# Patient Record
Sex: Male | Born: 1990 | Race: Black or African American | Hispanic: No | Marital: Single | Smoking: Current every day smoker
Health system: Southern US, Community
[De-identification: ages and names within clinical notes are randomized; demographics above are authoritative.]

## PROBLEM LIST (undated history)

## (undated) ENCOUNTER — Emergency Department (HOSPITAL_COMMUNITY): Admission: EM | Payer: Self-pay | Source: Home / Self Care

## (undated) DIAGNOSIS — F1410 Cocaine abuse, uncomplicated: Secondary | ICD-10-CM

## (undated) DIAGNOSIS — R440 Auditory hallucinations: Secondary | ICD-10-CM

## (undated) DIAGNOSIS — D573 Sickle-cell trait: Secondary | ICD-10-CM

## (undated) DIAGNOSIS — F1910 Other psychoactive substance abuse, uncomplicated: Secondary | ICD-10-CM

## (undated) DIAGNOSIS — F1994 Other psychoactive substance use, unspecified with psychoactive substance-induced mood disorder: Secondary | ICD-10-CM

---

## 2016-07-12 ENCOUNTER — Encounter (HOSPITAL_COMMUNITY): Payer: Self-pay

## 2016-07-12 ENCOUNTER — Emergency Department (HOSPITAL_COMMUNITY)
Admission: EM | Admit: 2016-07-12 | Discharge: 2016-07-12 | Disposition: A | Discharge status: Home / Self Care | Payer: Self-pay | Attending: Emergency Medicine | Admitting: Emergency Medicine

## 2016-07-12 DIAGNOSIS — F17200 Nicotine dependence, unspecified, uncomplicated: Secondary | ICD-10-CM | POA: Insufficient documentation

## 2016-07-12 DIAGNOSIS — B353 Tinea pedis: Secondary | ICD-10-CM | POA: Insufficient documentation

## 2016-07-12 HISTORY — DX: Sickle-cell trait: D57.3

## 2016-07-12 MED ORDER — CLOTRIMAZOLE 1 % EX CREA: TOPICAL_CREAM | CUTANEOUS | 1 refills | Status: DC

## 2016-07-12 MED ORDER — CLOTRIMAZOLE 1 % EX CREA
TOPICAL_CREAM | Freq: Two times a day (BID) | CUTANEOUS | Status: DC
  Administered 2016-07-12: 01:00:00 via TOPICAL
  Filled 2016-07-12: qty 15

## 2016-07-12 NOTE — ED Triage Notes (Signed)
Pt states that he has callus on his foot and dry skin in between in toes.

## 2016-07-12 NOTE — Discharge Instructions (Signed)
Keep your toes clean and dry. Apply the cream twice daily. Follow up with a [facility] in one week if your symptoms are not improving. Return to the emergency department if you have pain, swelling, redness, red streaks around your toes, you experience fever or any other concerning symptoms.

## 2016-07-12 NOTE — ED Provider Notes (Signed)
[facility] Provider Note   CSN: [id number] Arrival date & time: 07/12/16  0001     History   Chief Complaint Chief Complaint  Patient presents with  . Recurrent Skin Infections    HPI Clayton Vaughan is a 25 y.o. male.  HPI   Patient is a 25 year old male who presents to the emergency department with scaling and pain between his fourth and fifth toes bilaterally for 6 months. Patient states he feels that his feet are always damp. Pain is only with toe movement. He has not tried anything for this. He denies associated symptoms. He denies numbness/timing, weakness, fever.  Past Medical History:  Diagnosis Date  . Sickle cell trait (HCC)     There are no active problems to display for this patient.   History reviewed. No pertinent surgical history.     Home Medications    Prior to Admission medications   Medication Sig Start Date End Date Taking? Authorizing Provider  clotrimazole (LOTRIMIN) 1 % cream Apply to affected area 2 times daily 07/12/16   Jerre Simon, PA    Family History No family history on file.  Social History Social History  Substance Use Topics  . Smoking status: Current Every Day Smoker  . Smokeless tobacco: Never Used  . Alcohol use No     Allergies   Review of patient's allergies indicates no known allergies.   Review of Systems Review of Systems  Constitutional: Negative for chills and fever.  Gastrointestinal: Negative for nausea and vomiting.  Skin: Positive for wound.  Neurological: Negative for weakness and numbness.     Physical Exam Updated Vital Signs BP 130/87   Pulse 89   Temp 98.1 F (36.7 C)   Resp 18   Ht 6\' 2"  (1.88 m)   Wt 82.6 kg   SpO2 92%   BMI 23.37 kg/m   Physical Exam  Constitutional: He appears well-developed and well-nourished. No distress.  HENT:  Head: Normocephalic and atraumatic.  Eyes: Conjunctivae are normal.  Pulmonary/Chest: Effort normal. No respiratory distress.    Musculoskeletal: Normal range of motion.  Examination of bilateral feet revealed erythema and white scaly patches between the fourth and fifth digits. No surrounding swelling, red streaks, warmth. No signs of surrounding infection.  Neurological: He is alert. Coordination normal.  Skin: Skin is warm and dry. He is not diaphoretic.  Psychiatric: He has a normal mood and affect. His behavior is normal.  Nursing note and vitals reviewed.     ED Treatments / Results  Labs (all labs ordered are listed, but only abnormal results are displayed) Labs Reviewed - No data to display  EKG  EKG Interpretation None       Radiology No results found.  Procedures Procedures (including critical care time)  Medications Ordered in ED Medications  clotrimazole (LOTRIMIN) 1 % cream (not administered)     Initial Impression / Assessment and Plan / ED Course  I have reviewed the triage vital signs and the nursing notes.  Pertinent labs & imaging results that were available during my care of the patient were reviewed by me and considered in my medical decision making (see chart for details).  Clinical Course   Patient with athlete's feet. Discussed proper foot care. Prescribed Lotrimin cream. Instructed patient to follow-up with the [facility] 1 week if symptoms are not improving. Discussed strict return precautions. Patient expressed understanding to the discharge instructions.   Final Clinical Impressions(s) / ED Diagnoses  Final diagnoses:  Tinea pedis of both feet    New Prescriptions New Prescriptions   CLOTRIMAZOLE (LOTRIMIN) 1 % CREAM    Apply to affected area 2 times daily     Jerre SimonJessica L Isidro Monks, GeorgiaPA 07/12/16 0125    Azalia BilisKevin Campos, MD 07/12/16 [phone]

## 2016-07-12 NOTE — ED Notes (Signed)
Pt verbalized understanding of d/c instructions. Pt given lotrimin to use at home daily between 4th and 5th toes. Pt stable and NAD. VSS.

## 2016-08-28 ENCOUNTER — Emergency Department (HOSPITAL_COMMUNITY)
Admission: EM | Admit: 2016-08-28 | Discharge: 2016-08-29 | Disposition: A | Discharge status: Home / Self Care | Payer: Self-pay | Attending: Emergency Medicine | Admitting: Emergency Medicine

## 2016-08-28 ENCOUNTER — Encounter (HOSPITAL_COMMUNITY): Payer: Self-pay | Admitting: Emergency Medicine

## 2016-08-28 DIAGNOSIS — J069 Acute upper respiratory infection, unspecified: Secondary | ICD-10-CM

## 2016-08-28 DIAGNOSIS — F17200 Nicotine dependence, unspecified, uncomplicated: Secondary | ICD-10-CM | POA: Insufficient documentation

## 2016-08-28 LAB — RAPID STREP SCREEN (MED CTR MEBANE ONLY): Streptococcus, Group A Screen (Direct): NEGATIVE

## 2016-08-28 NOTE — ED Triage Notes (Addendum)
Patient with nasal congestion, muscle aches, sore throat.  Patient states that it has gotten worse in the last few days.  States that he is having headache with it also.  Patient states that he took some Tylenol with benadryl.

## 2016-08-28 NOTE — ED Notes (Signed)
PA at bedside.

## 2016-08-28 NOTE — ED Provider Notes (Signed)
[facility] Provider Note   CSN: [id number] Arrival date & time: 08/28/16  2305   By signing my name below, I, Teofilo Pod, attest that this documentation has been prepared under the direction and in the presence of Berma Harts, PA-C. Electronically Signed: Teofilo Pod, ED Scribe. 08/28/2016. 11:46 PM.   History   Chief Complaint Chief Complaint  Patient presents with  . Nasal Congestion  . Sore Throat  . URI   The history is provided by the patient. No language interpreter was used.   HPI Comments:  Clayton Vaughan is a 25 y.o. male who presents to the Emergency Department complaining of a constant URI symptoms x yesterday. Pt complains of associated headache, generalized myalgias, rhinorrhea, sore throat, cough. Pt is a smoker. Pt took tylenol and benadryl with no relief. Pt denies sick contact. Pt denies fever, ear pain.   Past Medical History:  Diagnosis Date  . Sickle cell trait (HCC)   . Sickle cell trait (HCC)     There are no active problems to display for this patient.   History reviewed. No pertinent surgical history.     Home Medications    Prior to Admission medications   Medication Sig Start Date End Date Taking? Authorizing Provider  clotrimazole (LOTRIMIN) 1 % cream Apply to affected area 2 times daily 07/12/16   Jerre Simon, PA    Family History History reviewed. No pertinent family history.  Social History Social History  Substance Use Topics  . Smoking status: Current Every Day Smoker  . Smokeless tobacco: Never Used  . Alcohol use No     Allergies   Review of patient's allergies indicates no known allergies.   Review of Systems Review of Systems  All other systems reviewed and are negative.    Physical Exam Updated Vital Signs BP 117/78 (BP Location: Right Arm)   Pulse 100   Temp 98.4 F (36.9 C) (Oral)   Resp 20   Ht 6\' 2"  (1.88 m)   Wt 182 lb 9 oz (82.8 kg)   SpO2 98%   BMI 23.44 kg/m    Physical Exam  Constitutional: He is oriented to person, place, and time. He appears well-developed and well-nourished. No distress.  HENT:  Head: Normocephalic and atraumatic.  Right Ear: Tympanic membrane normal.  Left Ear: Tympanic membrane normal.  Nose: Mucosal edema and rhinorrhea present. Right sinus exhibits no maxillary sinus tenderness and no frontal sinus tenderness. Left sinus exhibits no maxillary sinus tenderness and no frontal sinus tenderness.  Mouth/Throat: Uvula is midline, oropharynx is clear and moist and mucous membranes are normal. No trismus in the jaw. No oropharyngeal exudate, posterior oropharyngeal edema, posterior oropharyngeal erythema or tonsillar abscesses. No tonsillar exudate.  Eyes: Conjunctivae and EOM are normal. Pupils are equal, round, and reactive to light. Right eye exhibits no discharge. Left eye exhibits no discharge. No scleral icterus.  Neck: Normal range of motion. Neck supple.  Cardiovascular: Normal rate, normal heart sounds and intact distal pulses.  Exam reveals no gallop and no friction rub.   No murmur heard. Pulmonary/Chest: Effort normal and breath sounds normal. No respiratory distress. He has no wheezes. He has no rales. He exhibits no tenderness.  Lymphadenopathy:    He has no cervical adenopathy.  Neurological: He is alert and oriented to person, place, and time. Coordination normal.  Skin: Skin is warm and dry. No rash noted. He is not diaphoretic. No erythema. No pallor.  Psychiatric: He has a normal  mood and affect. His behavior is normal.  Nursing note and vitals reviewed.    ED Treatments / Results  DIAGNOSTIC STUDIES:  Oxygen Saturation is 98% on RA, normal by my interpretation.    COORDINATION OF CARE:  11:46 PM Discussed treatment plan with pt at bedside and pt agreed to plan.   Labs (all labs ordered are listed, but only abnormal results are displayed) Labs Reviewed  RAPID STRE[facility] SCREEN [facility])  CULTURE,  GROUP A STREP [facility])    EKG  EKG Interpretation None       Radiology No results found.  Procedures Procedures (including critical care time)  Medications Ordered in ED Medications - No data to display   Initial Impression / Assessment and Plan / ED Course  I have reviewed the triage vital signs and the nursing notes.  Pertinent labs & imaging results that were available during my care of the patient were reviewed by me and considered in my medical decision making (see chart for details).  Clinical Course   Pt symptoms consistent with URI, likely viral in etiology. Pt afebrile. All VSS. Lungs CTAB. Pt will be discharged with symptomatic treatment including flonase. Recommend OTC Mucinex, nasal rinsing, tylenol for body aches.  Discussed return precautions.  Pt is hemodynamically stable & in NAD prior to discharge.   Final Clinical Impressions(s) / ED Diagnoses   Final diagnoses:  Viral upper respiratory tract infection    New Prescriptions New Prescriptions   No medications on file  I personally performed the services described in this documentation, which was scribed in my presence. The recorded information has been reviewed and is accurate.     Dub MikesSamantha Tripp Yannis Broce, PA-C 08/29/16 0147    Layla MawKristen N Ward, DO 08/29/16 [phone]

## 2016-08-29 MED ORDER — FLUTICASONE PROPIONATE 50 MCG/ACT NA SUSP: 1.0000 | Freq: Every day | NASAL | 2 refills | Status: DC

## 2016-08-29 NOTE — Discharge Instructions (Signed)
Use flonase as prescribed. Recommend OTC Mucinex or Sudafed for nasal decongestant. Recommend nasal rinsing. Take tylenol for body aches. Follow up with PCP if symptoms do not improve. Return to the ED if you experience severe worsening of your symptoms, fevers, difficulty breathing or swallowing.

## 2016-08-31 LAB — CULTURE, GROUP A STREP (THRC)

## 2016-12-12 ENCOUNTER — Ambulatory Visit (HOSPITAL_COMMUNITY)
Admission: EM | Admit: 2016-12-12 | Discharge: 2016-12-12 | Disposition: A | Discharge status: Home / Self Care | Payer: Self-pay | Attending: Family Medicine | Admitting: Family Medicine

## 2016-12-12 ENCOUNTER — Encounter (HOSPITAL_COMMUNITY): Payer: Self-pay | Admitting: Emergency Medicine

## 2016-12-12 DIAGNOSIS — B029 Zoster without complications: Secondary | ICD-10-CM

## 2016-12-12 MED ORDER — HYDROCODONE-ACETAMINOPHEN 5-325 MG PO TABS: 1.0000 | ORAL_TABLET | Freq: Four times a day (QID) | ORAL | 0 refills | Status: DC | PRN

## 2016-12-12 MED ORDER — ACYCLOVIR 400 MG PO TABS: 800.0000 mg | ORAL_TABLET | Freq: Every day | ORAL | 0 refills | Status: DC

## 2016-12-12 NOTE — ED Provider Notes (Signed)
[facility]    CSN: [id number] Arrival date & time: 12/12/16  1645     History   Chief Complaint Chief Complaint  Patient presents with  . Herpes Zoster    HPI Clayton Vaughan is a 26 y.o. male.   This is a 26 year old man who presents to the [facility] urgent care center for evaluation of her rash he thinks is shingles. The rash began about 2 days ago and involves the right upper chest, right superior scapular area, and medial right biceps.      Past Medical History:  Diagnosis Date  . Sickle cell trait (HCC)   . Sickle cell trait (HCC)     There are no active problems to display for this patient.   History reviewed. No pertinent surgical history.     Home Medications    Prior to Admission medications   Medication Sig Start Date End Date Taking? Authorizing Provider  acyclovir (ZOVIRAX) 400 MG tablet Take 2 tablets (800 mg total) by mouth 5 (five) times daily. 12/12/16   Elvina Sidle, MD  clotrimazole (LOTRIMIN) 1 % cream Apply to affected area 2 times daily 07/12/16   Joyce Copa Focht, PA  fluticasone (FLONASE) 50 MCG/ACT nasal spray Place 1 spray into both nostrils daily. 08/29/16   Samantha Tripp Dowless, PA-C  HYDROcodone-acetaminophen (NORCO) 5-325 MG tablet Take 1 tablet by mouth every 6 (six) hours as needed for moderate pain. 12/12/16   Elvina Sidle, MD    Family History History reviewed. No pertinent family history.  Social History Social History  Substance Use Topics  . Smoking status: Current Every Day Smoker  . Smokeless tobacco: Never Used  . Alcohol use No     Allergies   Patient has no known allergies.   Review of Systems Review of Systems  HENT: Negative.   Skin: Positive for rash.     Physical Exam Triage Vital Signs ED Triage Vitals [12/12/16 1728]  Enc Vitals Group     BP 127/61     Pulse Rate 87     Resp 16     Temp 98 F (36.7 C)     Temp Source Oral     SpO2 97 %     Weight    Height      Head Circumference      Peak Flow      Pain Score 7     Pain Loc      Pain Edu?      Excl. in GC?    No data found.   Updated Vital Signs BP 127/61 (BP Location: Left Arm)   Pulse 87   Temp 98 F (36.7 C) (Oral)   Resp 16   SpO2 97%    Physical Exam  Constitutional: He is oriented to person, place, and time. He appears well-developed and well-nourished.  HENT:  Right Ear: External ear normal.  Left Ear: External ear normal.  Mouth/Throat: Oropharynx is clear and moist.  Eyes: Conjunctivae are normal.  Neck: Normal range of motion. Neck supple.  Pulmonary/Chest: Effort normal.  Musculoskeletal: Normal range of motion.  Neurological: He is alert and oriented to person, place, and time.  Skin: Skin is warm and dry. Rash noted. There is erythema.  Vesicular rash over the superior right scapula, right infra-clavicular area, and right medial upper arm  Nursing note and vitals reviewed.    UC Treatments / Results  Labs (all labs ordered are listed, but only abnormal results  are displayed) Labs Reviewed - No data to display  EKG  EKG Interpretation None       Radiology No results found.  Procedures Procedures (including critical care time)  Medications Ordered in UC Medications - No data to display   Initial Impression / Assessment and Plan / UC Course  I have reviewed the triage vital signs and the nursing notes.  Pertinent labs & imaging results that were available during my care of the patient were reviewed by me and considered in my medical decision making (see chart for details).     Final Clinical Impressions(s) / UC Diagnoses   Final diagnoses:  Herpes zoster without complication    New Prescriptions New Prescriptions   ACYCLOVIR (ZOVIRAX) 400 MG TABLET    Take 2 tablets (800 mg total) by mouth 5 (five) times daily.   HYDROCODONE-ACETAMINOPHEN (NORCO) 5-325 MG TABLET    Take 1 tablet by mouth every 6 (six) hours as needed for  moderate pain.     Elvina SidleKurt Knowledge Escandon, MD 12/12/16 1745

## 2016-12-12 NOTE — ED Triage Notes (Signed)
Pt here for rash/shingles onset 5 days   Sx include: burning and pain  A&O x4... NAD

## 2017-03-27 ENCOUNTER — Emergency Department (HOSPITAL_COMMUNITY)
Admission: EM | Admit: 2017-03-27 | Discharge: 2017-03-27 | Disposition: A | Discharge status: Home / Self Care | Payer: Self-pay | Attending: Emergency Medicine | Admitting: Emergency Medicine

## 2017-03-27 ENCOUNTER — Encounter (HOSPITAL_COMMUNITY): Payer: Self-pay | Admitting: *Deleted

## 2017-03-27 DIAGNOSIS — F17200 Nicotine dependence, unspecified, uncomplicated: Secondary | ICD-10-CM | POA: Insufficient documentation

## 2017-03-27 DIAGNOSIS — B353 Tinea pedis: Secondary | ICD-10-CM | POA: Insufficient documentation

## 2017-03-27 DIAGNOSIS — M21612 Bunion of left foot: Secondary | ICD-10-CM | POA: Insufficient documentation

## 2017-03-27 MED ORDER — CLOTRIMAZOLE 1 % EX CREA: TOPICAL_CREAM | CUTANEOUS | 1 refills | Status: DC

## 2017-03-27 NOTE — ED Notes (Signed)
See EDP secondary assessment.  

## 2017-03-27 NOTE — Discharge Instructions (Signed)
You have what appears to be athlete's foot. Apply the cream to the area twice daily for 4 weeks or for 1 week following when the area resolves. You may have what appears to be a bunion causing you pain near your big toes. You may require new/different, better fitting shoes for this issue as well as your reported flat-footedness.  Please follow up with the foot specialist. Call the number provided to set up an appointment.

## 2017-03-27 NOTE — ED Provider Notes (Signed)
[facility] Provider Note   CSN: [id number] Arrival date & time: 03/27/17  0445     History   Chief Complaint Chief Complaint  Patient presents with  . Foot Pain    HPI Clayton Vaughan is a 26 y.o. male.  HPI   Clayton Vaughan is a 26 y.o. male, with a history of Sickle cell trait, presenting to the ED with 2 foot related complaints. First, patient complains of burning and itching in between his toes bilaterally. This discomfort is mild to moderate. Second, patient complains of aching pain around the area of the great toe bilaterally, also mild to moderate in intensity. Patient admits that his toes feel "scrunched" together in his shoes and that he is "flat-footed." Denies neuro deficits, injuries, or any other complaints.    Past Medical History:  Diagnosis Date  . Sickle cell trait (HCC)   . Sickle cell trait (HCC)     There are no active problems to display for this patient.   History reviewed. No pertinent surgical history.     Home Medications    Prior to Admission medications   Medication Sig Start Date End Date Taking? Authorizing Provider  acyclovir (ZOVIRAX) 400 MG tablet Take 2 tablets (800 mg total) by mouth 5 (five) times daily. 12/12/16   Elvina Sidle, MD  clotrimazole (LOTRIMIN) 1 % cream Apply to affected area 2 times daily 07/12/16   Focht, Jessica L, PA  clotrimazole (LOTRIMIN) 1 % cream Apply to affected area 2 times daily for 4 weeks or for 1 week after area resolves. 03/27/17   Joy, Shawn C, PA-C  fluticasone (FLONASE) 50 MCG/ACT nasal spray Place 1 spray into both nostrils daily. 08/29/16   Dowless, Lelon Mast Tripp, PA-C  HYDROcodone-acetaminophen (NORCO) 5-325 MG tablet Take 1 tablet by mouth every 6 (six) hours as needed for moderate pain. 12/12/16   Elvina Sidle, MD    Family History No family history on file.  Social History Social History  Substance Use Topics  . Smoking status: Current Every Day Smoker  . Smokeless tobacco:  Never Used  . Alcohol use Yes     Allergies   Patient has no known allergies.   Review of Systems Review of Systems  Constitutional: Negative for fever.  Musculoskeletal: Positive for arthralgias. Negative for joint swelling.  Skin: Positive for color change.  Neurological: Negative for weakness and numbness.     Physical Exam Updated Vital Signs BP 111/78 (BP Location: Left Arm)   Pulse 82   Temp 98.3 F (36.8 C) (Oral)   Resp 18   Ht 6\' 2"  (1.88 m)   Wt 88.5 kg   SpO2 97%   BMI 25.04 kg/m   Physical Exam  Constitutional: He appears well-developed and well-nourished. No distress.  HENT:  Head: Normocephalic and atraumatic.  Eyes: Conjunctivae are normal.  Neck: Neck supple.  Cardiovascular: Normal rate and regular rhythm.   Pulmonary/Chest: Effort normal.  Musculoskeletal: He exhibits tenderness.  What appears to be hallux valgus deformities noted bilaterally. No wounds noted. Patient is ambulatory.  Neurological: He is alert.  No sensory deficits noted. Strength in the bilateral feet and toes 5 out of 5.  Skin: Skin is warm and dry. He is not diaphoretic. There is erythema.  Erythema and white scaly skin eruption especially noted between the fourth and fifth digits bilaterally. No open wounds noted.  Psychiatric: He has a normal mood and affect. His behavior is normal.  Nursing note and vitals reviewed.  ED Treatments / Results  Labs (all labs ordered are listed, but only abnormal results are displayed) Labs Reviewed - No data to display  EKG  EKG Interpretation None       Radiology No results found.  Procedures Procedures (including critical care time)  Medications Ordered in ED Medications - No data to display   Initial Impression / Assessment and Plan / ED Course  I have reviewed the triage vital signs and the nursing notes.  Pertinent labs & imaging results that were available during my care of the patient were reviewed by me and  considered in my medical decision making (see chart for details).     Patient presents with what appears to be hallux valgus deformities and tinea pedis. Podiatry referral. The patient was given instructions for home care as well as return precautions. Patient voices understanding of these instructions, accepts the plan, and is comfortable with discharge.  Final Clinical Impressions(s) / ED Diagnoses   Final diagnoses:  Bunion of great toe of left foot  Tinea pedis of both feet    New Prescriptions New Prescriptions   CLOTRIMAZOLE (LOTRIMIN) 1 % CREAM    Apply to affected area 2 times daily for 4 weeks or for 1 week after area resolves.     Anselm PancoastJoy, Shawn C, PA-C 03/27/17 [phone]    Dione BoozeGlick, David, MD 03/27/17 [phone]

## 2017-03-27 NOTE — ED Triage Notes (Signed)
C/o burning to bilateral feet burning x 1 year , came into day because it was his day off.

## 2017-04-02 ENCOUNTER — Emergency Department (HOSPITAL_COMMUNITY)
Admission: EM | Admit: 2017-04-02 | Discharge: 2017-04-02 | Disposition: A | Discharge status: Home / Self Care | Payer: Self-pay | Attending: Emergency Medicine | Admitting: Emergency Medicine

## 2017-04-02 ENCOUNTER — Emergency Department (HOSPITAL_COMMUNITY): Payer: Self-pay

## 2017-04-02 ENCOUNTER — Encounter (HOSPITAL_COMMUNITY): Payer: Self-pay | Admitting: Emergency Medicine

## 2017-04-02 DIAGNOSIS — N289 Disorder of kidney and ureter, unspecified: Secondary | ICD-10-CM | POA: Insufficient documentation

## 2017-04-02 DIAGNOSIS — B349 Viral infection, unspecified: Secondary | ICD-10-CM | POA: Insufficient documentation

## 2017-04-02 DIAGNOSIS — F17200 Nicotine dependence, unspecified, uncomplicated: Secondary | ICD-10-CM | POA: Insufficient documentation

## 2017-04-02 DIAGNOSIS — Z79899 Other long term (current) drug therapy: Secondary | ICD-10-CM | POA: Insufficient documentation

## 2017-04-02 LAB — BASIC METABOLIC PANEL
ANION GAP: 7 (ref 5–15)
BUN: 11 mg/dL (ref 6–20)
CALCIUM: 9.2 mg/dL (ref 8.9–10.3)
CO2: 28 mmol/L (ref 22–32)
Chloride: 105 mmol/L (ref 101–111)
Creatinine, Ser: 1.28 mg/dL — ABNORMAL HIGH (ref 0.61–1.24)
Glucose, Bld: 109 mg/dL — ABNORMAL HIGH (ref 65–99)
POTASSIUM: 3.6 mmol/L (ref 3.5–5.1)
Sodium: 140 mmol/L (ref 135–145)

## 2017-04-02 LAB — CBC WITH DIFFERENTIAL/PLATELET
BASOS PCT: 0 %
Basophils Absolute: 0 10*3/uL (ref 0.0–0.1)
EOS ABS: 0.3 10*3/uL (ref 0.0–0.7)
EOS PCT: 5 %
HCT: 39.4 % (ref 39.0–52.0)
Hemoglobin: 13.7 g/dL (ref 13.0–17.0)
Lymphocytes Relative: 42 %
Lymphs Abs: 2.9 10*3/uL (ref 0.7–4.0)
MCH: 28.7 pg (ref 26.0–34.0)
MCHC: 34.8 g/dL (ref 30.0–36.0)
MCV: 82.6 fL (ref 78.0–100.0)
MONO ABS: 0.5 10*3/uL (ref 0.1–1.0)
MONOS PCT: 8 %
NEUTROS PCT: 45 %
Neutro Abs: 3.1 10*3/uL (ref 1.7–7.7)
Platelets: 226 10*3/uL (ref 150–400)
RBC: 4.77 MIL/uL (ref 4.22–5.81)
RDW: 14.6 % (ref 11.5–15.5)
WBC: 6.9 10*3/uL (ref 4.0–10.5)

## 2017-04-02 LAB — URINALYSIS, ROUTINE W REFLEX MICROSCOPIC
Bilirubin Urine: NEGATIVE
GLUCOSE, UA: NEGATIVE mg/dL
Hgb urine dipstick: NEGATIVE
KETONES UR: NEGATIVE mg/dL
LEUKOCYTES UA: NEGATIVE
NITRITE: NEGATIVE
PH: 6 (ref 5.0–8.0)
PROTEIN: NEGATIVE mg/dL
Specific Gravity, Urine: 1.014 (ref 1.005–1.030)

## 2017-04-02 NOTE — ED Notes (Signed)
Pt states sometime has numbness in the hands, also has a raised bump on the bottom of the right foot that hurt it started to hurt two day ago.

## 2017-04-02 NOTE — ED Provider Notes (Signed)
[facility]EM[facility]Y DEPT Provider Note   CSN: [id number] Arrival date & time: 04/02/17  [id number]  By signing my name below, I, Karren CobbleNy'kea Lewis, attest that this documentation has been prepared under the direction and in the presence of Dione BoozeGlick, Chasiti Waddington, MD. Electronically Signed: Karren CobbleNy'kea Lewis, ED Scribe. 04/02/17. 3:04 AM.  History   Chief Complaint Chief Complaint  Patient presents with  . Foot Pain    bilat  . multiple complaints   HPI  HPI Comments: Clayton Vaughan is a 26 y.o. male with a PMHx of sickle cell trait,  who presents to the Emergency Department complaining of constant fatigue for a few days. His associated symptoms include subjective fever, sweats, body ache, fatigue, and dizziness. No treatment tried PTA. He notes his symptoms have been consistent alongside his fatigue. Denies nausea, vomiting, diarrhea, and chills. No other acute associated symptoms at this time.    Past Medical History:  Diagnosis Date  . Sickle cell trait (HCC)   . Sickle cell trait (HCC)     There are no active problems to display for this patient.   History reviewed. No pertinent surgical history.     Home Medications    Prior to Admission medications   Medication Sig Start Date End Date Taking? Authorizing Provider  acyclovir (ZOVIRAX) 400 MG tablet Take 2 tablets (800 mg total) by mouth 5 (five) times daily. 12/12/16   Elvina SidleLauenstein, Kurt, MD  clotrimazole (LOTRIMIN) 1 % cream Apply to affected area 2 times daily 07/12/16   Focht, Jessica L, PA  clotrimazole (LOTRIMIN) 1 % cream Apply to affected area 2 times daily for 4 weeks or for 1 week after area resolves. 03/27/17   Joy, Shawn C, PA-C  fluticasone (FLONASE) 50 MCG/ACT nasal spray Place 1 spray into both nostrils daily. 08/29/16   Dowless, Lelon MastSamantha Tripp, PA-C  HYDROcodone-acetaminophen (NORCO) 5-325 MG tablet Take 1 tablet by mouth every 6 (six) hours as needed for moderate pain. 12/12/16   Elvina SidleLauenstein, Kurt, MD    Family History History reviewed. No  pertinent family history.  Social History Social History  Substance Use Topics  . Smoking status: Current Every Day Smoker  . Smokeless tobacco: Never Used  . Alcohol use Yes     Allergies   Patient has no known allergies.   Review of Systems Review of Systems  Constitutional: Positive for diaphoresis, fatigue and fever. Negative for chills.  Gastrointestinal: Negative for diarrhea, nausea and vomiting.  Musculoskeletal: Positive for arthralgias and myalgias.  Neurological: Positive for dizziness.  All other systems reviewed and are negative.    Physical Exam Updated Vital Signs BP 132/84 (BP Location: Right Arm)   Pulse 76   Temp 98 F (36.7 C) (Oral)   Ht 6\' 2"  (1.88 m)   Wt 195 lb (88.5 kg)   SpO2 97%   BMI 25.04 kg/m   Physical Exam  Constitutional: He is oriented to person, place, and time. He appears well-developed and well-nourished.  HENT:  Head: Normocephalic and atraumatic.  Eyes: EOM are normal. Pupils are equal, round, and reactive to light.  Neck: Normal range of motion. Neck supple. No JVD present.  Cardiovascular: Normal rate, regular rhythm and normal heart sounds.   No murmur heard. Pulmonary/Chest: Effort normal and breath sounds normal. He has no wheezes. He has no rales. He exhibits no tenderness.  Abdominal: Soft. Bowel sounds are normal. He exhibits no distension and no mass. There is no tenderness.  Musculoskeletal: Normal range of motion. He exhibits no edema.  Lymphadenopathy:    He has no cervical adenopathy.  Neurological: He is alert and oriented to person, place, and time. No cranial nerve deficit. He exhibits normal muscle tone. Coordination normal.  Skin: Skin is warm and dry. No rash noted.  Psychiatric: He has a normal mood and affect. His behavior is normal. Judgment and thought content normal.  Nursing note and vitals reviewed.    ED Treatments / Results   DIAGNOSTIC STUDIES: Oxygen Saturation is 97% on RA, normal by my  interpretation.   COORDINATION OF CARE: 2:57 AM-Discussed next steps with pt. Pt verbalized understanding and is agreeable with the plan.   Labs (all labs ordered are listed, but only abnormal results are displayed) Labs Reviewed  BASIC METABOLIC PANEL - Abnormal; Notable for the following:       Result Value   Glucose, Bld 109 (*)    Creatinine, Ser 1.28 (*)    All other components within normal limits  URINALYSIS, ROUTINE W REFLEX MICROSCOPIC  CBC WITH DIFFERENTIAL/PLATELET    Radiology Dg Chest 2 View  Result Date: 04/02/2017 CLINICAL DATA:  Weakness.  Dizziness. EXAM: CHEST  2 VIEW COMPARISON:  None. FINDINGS: The cardiomediastinal contours are normal. The lungs are clear. Pulmonary vasculature is normal. No consolidation, pleural effusion, or pneumothorax. No acute osseous abnormalities are seen. IMPRESSION: No acute abnormality.  Unremarkable radiographs of the chest. Electronically Signed   By: Rubye Oaks M.D.   On: 04/02/2017 03:38    Procedures Procedures (including critical care time)  Medications Ordered in ED Medications - No data to display   Initial Impression / Assessment and Plan / ED Course  I have reviewed the triage vital signs and the nursing notes.  Pertinent labs & imaging results that were available during my care of the patient were reviewed by me and considered in my medical decision making (see chart for details).  Subjective fever and sweats which probably is a viral illness. Screening labs are obtained including urinalysis and chest x-ray. He has mild elevation of serum creatinine but results are otherwise unremarkable. Borderline right shift is present consistent with viral illness. Patient is reassured regarding findings and advised to take acetaminophen for fever and aching, drink plenty of fluids. Return precautions discussed.  Final Clinical Impressions(s) / ED Diagnoses   Final diagnoses:  Viral illness  Renal insufficiency    New  Prescriptions New Prescriptions   No medications on file   I personally performed the services described in this documentation, which was scribed in my presence. The recorded information has been reviewed and is accurate.       Dione Booze, MD 04/02/17 [phone]

## 2017-04-02 NOTE — ED Triage Notes (Signed)
Pt presents with bilat foot pain x more than one week; pt states he and his GF not getting along so she put him out and he is homeless, works multiple jobs and long hours and not able to care for his feet; pt reports blisters, odor and pain Pt also states that he is feeling weak, dizzy, fatigued, nauseated, not himself

## 2017-04-02 NOTE — Discharge Instructions (Signed)
Drink plenty of fluids. Take acetaminophen as needed.

## 2017-04-06 ENCOUNTER — Ambulatory Visit (INDEPENDENT_AMBULATORY_CARE_PROVIDER_SITE_OTHER): Payer: Self-pay

## 2017-04-06 ENCOUNTER — Encounter (HOSPITAL_COMMUNITY): Payer: Self-pay | Admitting: Emergency Medicine

## 2017-04-06 ENCOUNTER — Ambulatory Visit (HOSPITAL_COMMUNITY)
Admission: EM | Admit: 2017-04-06 | Discharge: 2017-04-06 | Disposition: A | Discharge status: Home / Self Care | Payer: Self-pay | Attending: Internal Medicine | Admitting: Internal Medicine

## 2017-04-06 DIAGNOSIS — M79671 Pain in right foot: Secondary | ICD-10-CM

## 2017-04-06 DIAGNOSIS — S90821A Blister (nonthermal), right foot, initial encounter: Secondary | ICD-10-CM

## 2017-04-06 MED ORDER — TRAMADOL HCL 50 MG PO TABS: 50.0000 mg | ORAL_TABLET | Freq: Two times a day (BID) | ORAL | 0 refills | Status: DC | PRN

## 2017-04-06 NOTE — ED Triage Notes (Signed)
[facility] a [facility] of a blister on his right foot x 4 days.

## 2017-04-06 NOTE — ED Provider Notes (Signed)
CSN: [id number]     Arrival date & time 04/06/17  1300 History   None    Chief Complaint  Patient presents with  . Blister   (Consider location/radiation/quality/duration/timing/severity/associated sxs/prior Treatment) HPI 26 year old black male comes in's office today with complaints of right lateral foot pain and blister 4 days. States that he has a history of getting painful calluses on both feet. Denies foot injury. States that he has been up on his feet more with his jobs. 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 States it is a large blister at the plantar aspect of his fourth metatarsal head. Foot has been getting progressively more painful with weightbearing. Denies fever chills. No previous problems with blister formation of this kind before onset. History of sickle cell trait. Denies history of diabetes, or neuropathy. Past Medical History:  Diagnosis Date  . Sickle cell trait (HCC)   . Sickle cell trait (HCC)    History reviewed. No pertinent surgical history. History reviewed. No pertinent family history. Social History  Substance Use Topics  . Smoking status: Current Every Day Smoker  . Smokeless tobacco: Never Used  . Alcohol use Yes    Review of Systems  Constitutional: Positive for activity change (Due to right foot pain).  HENT: Negative.   Respiratory: Negative.   Cardiovascular: Negative.   Gastrointestinal: Negative.   Genitourinary: Negative.   Musculoskeletal: Positive for gait problem.  Skin:       Blister plantar right foot  Neurological: Negative for numbness.  Psychiatric/Behavioral: Negative.     Allergies  Patient has no known allergies.  Home Medications   Prior to Admission medications   Medication Sig Start Date End Date Taking? Authorizing Provider  traMADol (ULTRAM) 50 MG tablet Take 1 tablet (50 mg total) by mouth every 12 (twelve) hours as needed. 04/06/17   Naida Sleight, PA-C   Meds  Ordered and Administered this Visit  Medications - No data to display  BP 109/79 (BP Location: Right Arm)   Pulse 62   Temp 98 F (36.7 C) (Oral)   Resp 18   SpO2 100%  No data found.   Physical Exam  Constitutional: He is oriented to person, place, and time.  HENT:  Head: Normocephalic and atraumatic.  Eyes: EOM are normal. Pupils are equal, round, and reactive to light.  Neck: Normal range of motion.  Pulmonary/Chest: No respiratory distress.  Musculoskeletal:  Large calluses bilateral medial great toes. These areas are tender. Right foot does have a large blister plantar aspect of the fourth MTP joint. This area is moderately tender. No drainage. No gross signs of infection. He does have slight mid to lateral foot swelling. He is moderate- markedly tender over the fourth metatarsal and less tender over the fifth.  Neurological: He is alert and oriented to person, place, and time.    Urgent Care Course     Procedures (including critical care time)  Labs Review Labs Reviewed - No data to display  Imaging Review Dg Foot Complete Right  Result Date: 04/06/2017 CLINICAL DATA:  Patient states that he developed a blister on the bottom of his right foot that has continued to get worse and painful, NKI No priors. EXAM: RIGHT FOOT COMPLETE - 3+ VIEW COMPARISON:  None. FINDINGS: No fracture. No bone lesion. No bone resorption is seen to suggest osteomyelitis. The joints are normally spaced and aligned.  No arthropathic change. The soft tissues are  unremarkable.  No soft tissue air. IMPRESSION: No fracture, bone lesion or joint abnormality. Soft tissues unremarkable. Electronically Signed   By: Amie Portlandavid  Ormond M.D.   On: 04/06/2017 14:40     Visual Acuity Review  Right Eye Distance:   Left Eye Distance:   Bilateral Distance:    Right Eye Near:   Left Eye Near:    Bilateral Near:         MDM   1. Right foot pain   2. Blister of plantar aspect of right foot, initial  encounter    Patient was given a postop shoe to wear. Recommend that he be out of work a couple of days to get his foot some time to rest. Can use over-the-counter Tylenol and/or ibuprofen for pain. Elevate foot to help decrease any swelling. Prescription given for Ultram 50 mg 1 tab by mouth every 12 hours when necessary for pain. Recommend that he follow-up with orthopedics in about 1 week for recheck. X-rays today do not show an obvious fracture but  I explained to patient that he could have a stress fracture of his foot that is not seen on today's images. Recommend getting custom orthotics and this can be discussed further with orthopedics when he sees them,  all questions answered    Naida SleightOwens, Keilee Denman M, Cordelia Poche-C 04/06/17 1522

## 2017-05-15 ENCOUNTER — Emergency Department (HOSPITAL_COMMUNITY)
Admission: EM | Admit: 2017-05-15 | Discharge: 2017-05-15 | Disposition: A | Discharge status: Home / Self Care | Payer: Self-pay | Attending: Emergency Medicine | Admitting: Emergency Medicine

## 2017-05-15 ENCOUNTER — Emergency Department (HOSPITAL_COMMUNITY): Payer: Self-pay

## 2017-05-15 ENCOUNTER — Encounter (HOSPITAL_COMMUNITY): Payer: Self-pay

## 2017-05-15 DIAGNOSIS — F17210 Nicotine dependence, cigarettes, uncomplicated: Secondary | ICD-10-CM | POA: Insufficient documentation

## 2017-05-15 DIAGNOSIS — D573 Sickle-cell trait: Secondary | ICD-10-CM | POA: Insufficient documentation

## 2017-05-15 DIAGNOSIS — R1032 Left lower quadrant pain: Secondary | ICD-10-CM | POA: Insufficient documentation

## 2017-05-15 LAB — CBC WITH DIFFERENTIAL/PLATELET
BASOS ABS: 0 10*3/uL (ref 0.0–0.1)
Basophils Relative: 0 %
EOS ABS: 0.1 10*3/uL (ref 0.0–0.7)
EOS PCT: 2 %
HCT: 43.1 % (ref 39.0–52.0)
Hemoglobin: 14.9 g/dL (ref 13.0–17.0)
Lymphocytes Relative: 40 %
Lymphs Abs: 2.2 10*3/uL (ref 0.7–4.0)
MCH: 28.7 pg (ref 26.0–34.0)
MCHC: 34.6 g/dL (ref 30.0–36.0)
MCV: 82.9 fL (ref 78.0–100.0)
Monocytes Absolute: 0.5 10*3/uL (ref 0.1–1.0)
Monocytes Relative: 9 %
Neutro Abs: 2.6 10*3/uL (ref 1.7–7.7)
Neutrophils Relative %: 49 %
PLATELETS: 192 10*3/uL (ref 150–400)
RBC: 5.2 MIL/uL (ref 4.22–5.81)
RDW: 14.1 % (ref 11.5–15.5)
WBC: 5.4 10*3/uL (ref 4.0–10.5)

## 2017-05-15 LAB — COMPREHENSIVE METABOLIC PANEL
ALT: 22 U/L (ref 17–63)
AST: 25 U/L (ref 15–41)
Albumin: 3.9 g/dL (ref 3.5–5.0)
Alkaline Phosphatase: 49 U/L (ref 38–126)
Anion gap: 7 (ref 5–15)
BILIRUBIN TOTAL: 0.8 mg/dL (ref 0.3–1.2)
BUN: 12 mg/dL (ref 6–20)
CALCIUM: 9.2 mg/dL (ref 8.9–10.3)
CO2: 28 mmol/L (ref 22–32)
CREATININE: 1.28 mg/dL — AB (ref 0.61–1.24)
Chloride: 105 mmol/L (ref 101–111)
GFR calc Af Amer: >60 mL/min (ref >60)
Glucose, Bld: 107 mg/dL — ABNORMAL HIGH (ref 65–99)
POTASSIUM: 3.9 mmol/L (ref 3.5–5.1)
Sodium: 140 mmol/L (ref 135–145)
TOTAL PROTEIN: 6.8 g/dL (ref 6.5–8.1)

## 2017-05-15 NOTE — ED Notes (Signed)
Pt still wanting to leave, asking to sign out AMA.  MD aware.  This RN called X-ray, who said the patient was next on the list and that they went a transporter to get him.  Pt still wants to leave.

## 2017-05-15 NOTE — ED Provider Notes (Signed)
[facility] Provider Note   CSN: [id number] Arrival date & time: 05/15/17  1448  By signing my name below, I, Deland Pretty, attest that this documentation has been prepared under the direction and in the presence of Maryrose Colvin, PA-C. Electronically Signed: Deland Pretty, ED Scribe. 05/15/17. 4:53 PM.  History   Chief Complaint Chief Complaint  Patient presents with  . Abdominal Pain   The history is provided by the patient. No language interpreter was used.   HPI Comments: Rahman Ferrall is a 26 y.o. male who presents to the Emergency Department complaining of sudden onset of constant, mild left abdominal pain that began this morning. He also notes a mass to his left testicle that began this morning that is only painful when squeezed. He denies performing any physical activity the day before. No methods or medications tried for his pain. He reports a h/x of sickle cell trait and denies a h/x of PUD and diverticulitis. NKDA. The pt denies appetite change, fever, SOB, chest pain, constipation, diarrhea, nausea, emesis, dysuria, hematuria, and penile discharge.  Past Medical History:  Diagnosis Date  . Sickle cell trait (HCC)   . Sickle cell trait (HCC)     There are no active problems to display for this patient.   History reviewed. No pertinent surgical history.     Home Medications    Prior to Admission medications   Medication Sig Start Date End Date Taking? Authorizing Provider  traMADol (ULTRAM) 50 MG tablet Take 1 tablet (50 mg total) by mouth every 12 (twelve) hours as needed. 04/06/17   Naida Sleight, PA-C    Family History No family history on file.  Social History Social History  Substance Use Topics  . Smoking status: Current Every Day Smoker    Packs/day: 1.00    Types: Cigarettes  . Smokeless tobacco: Never Used  . Alcohol use Yes     Allergies   Patient has no known allergies.   Review of Systems Review of Systems    Constitutional: Negative for appetite change and fever.  Respiratory: Negative for shortness of breath.   Cardiovascular: Negative for chest pain.  Gastrointestinal: Positive for abdominal pain. Negative for constipation, diarrhea, nausea and vomiting.  Genitourinary: Positive for testicular pain. Negative for discharge, dysuria and hematuria.  All other systems reviewed and are negative.    Physical Exam Updated Vital Signs BP 128/84   Pulse 64   Temp 98.4 F (36.9 C) (Oral)   Resp 17   Ht 6\' 1"  (1.854 m)   Wt 79.4 kg (175 lb)   SpO2 100%   BMI 23.09 kg/m   Physical Exam  Constitutional: He appears well-developed and well-nourished. No distress.  HENT:  Head: Normocephalic and atraumatic.  Eyes: Conjunctivae and EOM are normal. No scleral icterus.  Neck: Normal range of motion.  Pulmonary/Chest: Effort normal. No respiratory distress.  Abdominal: He exhibits no distension. There is tenderness. There is no rebound and no guarding.  LLQ tenderness.  Genitourinary: Testes normal and penis normal. Cremasteric reflex is present. Right testis shows no mass, no swelling and no tenderness. Left testis shows no mass, no swelling and no tenderness. Circumcised.  Genitourinary Comments: No masses palpated. No tenderness, temperature color change noted. Chaperone present for the entirety of GU exam.  Neurological: He is alert.  Skin: No rash noted. He is not diaphoretic.  Psychiatric: He has a normal mood and affect.  Nursing note and vitals reviewed.    ED Treatments /  Results   DIAGNOSTIC STUDIES: Oxygen Saturation is 100% on RA, normal by my interpretation.   COORDINATION OF CARE: 4:50 PM-Discussed next steps with pt. Pt verbalized understanding and is agreeable with the plan.   Labs (all labs ordered are listed, but only abnormal results are displayed) Labs Reviewed  COMPREHENSIVE METABOLIC PANEL - Abnormal; Notable for the following:       Result Value   Glucose,  Bld 107 (*)    Creatinine, Ser 1.28 (*)    All other components within normal limits  CBC WITH DIFFERENTIAL/PLATELET    EKG  EKG Interpretation None       Radiology US Scrotum  Result Date: 05/15/2017 CLINICAL DATA:  Lump at the midline of the scrotum posteriorly. EXAM: SCROTAL ULTRASOUND DOPPLER ULTRASOUND OF THE TESTICLES TECHNIQUE: Complete ultrasound examination of the testicles, epididymis, and other scrotal structures was performed. Color and spectral Doppler ultrasound were also utilized to evaluate blood flow to the testicles. COMPARISON:  None. FINDINGS: Right testicle Measurements: 5.1 x 2.3 x 3.7 cm. No mass or microlithiasis visualized. Left testicle Measurements: 4.8 x 2.3 x 5.5 cm. No solid masses. There is a 2 mm cyst in the left testicle of no significance. Right epididymis:  Normal in size and appearance. Left epididymis:  Normal in size and appearance. Hydrocele:  Small bilateral hydroceles. Varicocele:  None visualized. Pulsed Doppler interrogation of both testes demonstrates normal low resistance arterial and venous waveforms bilaterally. IMPRESSION: 1. No abnormalities seen in the region of the lump felt by the patient posteriorly at the midline of the scrotum. 2. No other significant abnormalities. Electronically Signed   By: Gerome Sam III M.D   On: 05/15/2017 18:41   [place] Art/ven Flow Abd Pelv Doppler  Result Date: 05/15/2017 CLINICAL DATA:  Lump at the midline of the scrotum posteriorly. EXAM: SCROTAL ULTRASOUND DOPPLER ULTRASOUND OF THE TESTICLES TECHNIQUE: Complete ultrasound examination of the testicles, epididymis, and other scrotal structures was performed. Color and spectral Doppler ultrasound were also utilized to evaluate blood flow to the testicles. COMPARISON:  None. FINDINGS: Right testicle Measurements: 5.1 x 2.3 x 3.7 cm. No mass or microlithiasis visualized. Left testicle Measurements: 4.8 x 2.3 x 5.5 cm. No solid masses. There is a 2 mm cyst in the left  testicle of no significance. Right epididymis:  Normal in size and appearance. Left epididymis:  Normal in size and appearance. Hydrocele:  Small bilateral hydroceles. Varicocele:  None visualized. Pulsed Doppler interrogation of both testes demonstrates normal low resistance arterial and venous waveforms bilaterally. IMPRESSION: 1. No abnormalities seen in the region of the lump felt by the patient posteriorly at the midline of the scrotum. 2. No other significant abnormalities. Electronically Signed   By: Gerome Sam III M.D   On: 05/15/2017 18:41    Procedures Procedures (including critical care time)  Medications Ordered in ED Medications - No data to display   Initial Impression / Assessment and Plan / ED Course  I have reviewed the triage vital signs and the nursing notes.  Pertinent labs & imaging results that were available during my care of the patient were reviewed by me and considered in my medical decision making (see chart for details).     Patient presents to the ED for left-sided abdominal pain and mass on testicle that he noted earlier this morning. Patient states that he has no previous history of the abdominal pain or testicular mass. He states that he does intermittently do self testicular exams and has not  noticed anything like this before. He reports pain with palpation of the mass. He denies any urinary complaints. He is afebrile with no history of fever.On physical exam he does not have any tenderness of either testicle or any mass noted. Ultrasound was obtained and returned as negative. CBC and CMP returned as unremarkable. Patient is tender to palpation on the left lower quadrant. No rebound or guarding tenderness and low suspicion for surgical abdomen. I obtained an abdominal x-ray however patient stated that he wanted to leave due to being here for so long. A nurse and x-ray tech urged patient to stay to complete the x-ray and advised him that he could follow up with  the results when available. Patient was insistent on leaving AMA.  Final Clinical Impressions(s) / ED Diagnoses   Final diagnoses:  Left lower quadrant pain    New Prescriptions Discharge Medication List as of 05/15/2017  7:49 PM     I personally performed the services described in this documentation, which was scribed in my presence. The recorded information has been reviewed and is accurate.     Dietrich PatesKhatri, Paden Kuras, PA-C 05/15/17 2035    Alvira MondaySchlossman, Erin, MD 05/16/17 1336

## 2017-05-15 NOTE — ED Notes (Signed)
Patient to nursing station wanting to leave.  RN and MD informed.

## 2017-05-15 NOTE — ED Notes (Signed)
Pt wanting to leave.  X-ray Tech told pt exam would take 5 minutes.  Pt still wanting to leave.  Pt noted to be walking away from nurses' station, ED Tech, and [employer]-[facility] Tech.  Pt walked towards Pod E.

## 2017-05-15 NOTE — ED Triage Notes (Addendum)
Per Pt, Pt is coming from home with complaints of tenderness noted to the left abdomen when touching the skin. Pt also reports a mass noted on the outside of the left testicle. Denies any discharge or any inner pain. Denies N/V, burning with urination.

## 2017-05-15 NOTE — ED Notes (Signed)
Patient went to u/s 

## 2017-05-23 ENCOUNTER — Emergency Department (HOSPITAL_COMMUNITY): Payer: Self-pay

## 2017-05-23 ENCOUNTER — Encounter (HOSPITAL_COMMUNITY): Payer: Self-pay | Admitting: Emergency Medicine

## 2017-05-23 ENCOUNTER — Emergency Department (HOSPITAL_COMMUNITY)
Admission: EM | Admit: 2017-05-23 | Discharge: 2017-05-23 | Disposition: A | Discharge status: Home / Self Care | Payer: Self-pay | Attending: Emergency Medicine | Admitting: Emergency Medicine

## 2017-05-23 DIAGNOSIS — F17210 Nicotine dependence, cigarettes, uncomplicated: Secondary | ICD-10-CM | POA: Insufficient documentation

## 2017-05-23 DIAGNOSIS — J40 Bronchitis, not specified as acute or chronic: Secondary | ICD-10-CM

## 2017-05-23 DIAGNOSIS — J209 Acute bronchitis, unspecified: Secondary | ICD-10-CM | POA: Insufficient documentation

## 2017-05-23 DIAGNOSIS — R079 Chest pain, unspecified: Secondary | ICD-10-CM | POA: Insufficient documentation

## 2017-05-23 LAB — CBG MONITORING, ED: GLUCOSE-CAPILLARY: 107 mg/dL — AB (ref 65–99)

## 2017-05-23 MED ORDER — AMOXICILLIN 500 MG PO CAPS: 500.0000 mg | ORAL_CAPSULE | Freq: Three times a day (TID) | ORAL | 0 refills | Status: DC

## 2017-05-23 NOTE — ED Triage Notes (Signed)
Pt sts fatigue and cough with body aches

## 2017-05-23 NOTE — ED Provider Notes (Signed)
[facility] Provider Note   CSN: [id number] Arrival date & time: 05/23/17  1812     History   Chief Complaint Chief Complaint  Patient presents with  . Fatigue  . Cough    HPI Clayton Vaughan is a 26 y.o. male.  Patient complains of a cough with yellow sputum production. He also complains of weakness.   The history is provided by the patient. No language interpreter was used.  Cough  This is a new problem. The current episode started more than 2 days ago. The problem occurs constantly. The cough is productive of sputum. There has been no fever. The fever has been present for less than 1 day. Associated symptoms include chest pain. Pertinent negatives include no headaches.    Past Medical History:  Diagnosis Date  . Sickle cell trait (HCC)   . Sickle cell trait (HCC)     There are no active problems to display for this patient.   History reviewed. No pertinent surgical history.     Home Medications    Prior to Admission medications   Medication Sig Start Date End Date Taking? Authorizing Provider  amoxicillin (AMOXIL) 500 MG capsule Take 1 capsule (500 mg total) by mouth 3 (three) times daily. 05/23/17   Bethann Berkshire, MD  traMADol (ULTRAM) 50 MG tablet Take 1 tablet (50 mg total) by mouth every 12 (twelve) hours as needed. 04/06/17   Naida Sleight, PA-C    Family History History reviewed. No pertinent family history.  Social History Social History  Substance Use Topics  . Smoking status: Current Every Day Smoker    Packs/day: 1.00    Types: Cigarettes  . Smokeless tobacco: Never Used  . Alcohol use Yes     Allergies   Patient has no known allergies.   Review of Systems Review of Systems  Constitutional: Negative for appetite change and fatigue.  HENT: Negative for congestion, ear discharge and sinus pressure.   Eyes: Negative for discharge.  Respiratory: Positive for cough.   Cardiovascular: Positive for chest pain.    Gastrointestinal: Negative for abdominal pain and diarrhea.  Genitourinary: Negative for frequency and hematuria.  Musculoskeletal: Negative for back pain.  Skin: Negative for rash.  Neurological: Negative for seizures and headaches.  Psychiatric/Behavioral: Negative for hallucinations.     Physical Exam Updated Vital Signs BP (!) 123/93   Pulse (!) 59   Temp (!) 97.3 F (36.3 C) (Oral)   Resp 16   SpO2 100%   Physical Exam  Constitutional: He is oriented to person, place, and time. He appears well-developed.  HENT:  Head: Normocephalic.  Eyes: Conjunctivae and EOM are normal. No scleral icterus.  Neck: Neck supple. No thyromegaly present.  Cardiovascular: Normal rate and regular rhythm.  Exam reveals no gallop and no friction rub.   No murmur heard. Pulmonary/Chest: No stridor. He has no wheezes. He has no rales. He exhibits no tenderness.  Abdominal: He exhibits no distension. There is no tenderness. There is no rebound.  Musculoskeletal: Normal range of motion. He exhibits no edema.  Lymphadenopathy:    He has no cervical adenopathy.  Neurological: He is oriented to person, place, and time. He exhibits normal muscle tone. Coordination normal.  Skin: No rash noted. No erythema.  Psychiatric: He has a normal mood and affect. His behavior is normal.     ED Treatments / Results  Labs (all labs ordered are listed, but only abnormal results are displayed) Labs Reviewed  CBG MONITORING,  ED - Abnormal; Notable for the following:       Result Value   Glucose-Capillary 107 (*)    All other components within normal limits    EKG  EKG Interpretation None       Radiology Dg Chest 2 View  Result Date: 05/23/2017 CLINICAL DATA:  Cough and fatigue EXAM: CHEST  2 VIEW COMPARISON:  Apr 02, 2017 FINDINGS: Lungs are clear. Heart size and pulmonary vascularity are normal. No adenopathy. No bone lesions. IMPRESSION: No edema or consolidation. Electronically Signed   By: Bretta BangWilliam   Woodruff III M.D.   On: 05/23/2017 20:12    Procedures Procedures (including critical care time)  Medications Ordered in ED Medications - No data to display   Initial Impression / Assessment and Plan / ED Course  I have reviewed the triage vital signs and the nursing notes.  Pertinent labs & imaging results that were available during my care of the patient were reviewed by me and considered in my medical decision making (see chart for details).     Patient with bronchitis. Patient will follow-up with his family doctor if not improving and he is started on amoxicillin  Final Clinical Impressions(s) / ED Diagnoses   Final diagnoses:  Bronchitis    New Prescriptions New Prescriptions   AMOXICILLIN (AMOXIL) 500 MG CAPSULE    Take 1 capsule (500 mg total) by mouth 3 (three) times daily.     Bethann BerkshireZammit, Heywood Tokunaga, MD 05/23/17 2207

## 2017-05-23 NOTE — Discharge Instructions (Signed)
Drink plenty of fluids and rest for the next day and follow-up if any problems

## 2017-05-23 NOTE — ED Notes (Signed)
Patient transported to X-ray 

## 2017-06-09 ENCOUNTER — Emergency Department (HOSPITAL_COMMUNITY)
Admission: EM | Admit: 2017-06-09 | Discharge: 2017-06-09 | Disposition: A | Discharge status: Home / Self Care | Payer: Self-pay | Attending: Physician Assistant | Admitting: Physician Assistant

## 2017-06-09 ENCOUNTER — Emergency Department (HOSPITAL_COMMUNITY): Payer: Self-pay

## 2017-06-09 ENCOUNTER — Encounter (HOSPITAL_COMMUNITY): Payer: Self-pay | Admitting: Nurse Practitioner

## 2017-06-09 DIAGNOSIS — L089 Local infection of the skin and subcutaneous tissue, unspecified: Secondary | ICD-10-CM | POA: Insufficient documentation

## 2017-06-09 DIAGNOSIS — F17210 Nicotine dependence, cigarettes, uncomplicated: Secondary | ICD-10-CM | POA: Insufficient documentation

## 2017-06-09 DIAGNOSIS — D573 Sickle-cell trait: Secondary | ICD-10-CM | POA: Insufficient documentation

## 2017-06-09 MED ORDER — CEPHALEXIN 500 MG PO CAPS: 500.0000 mg | ORAL_CAPSULE | Freq: Four times a day (QID) | ORAL | 0 refills | Status: AC

## 2017-06-09 MED ORDER — IBUPROFEN 600 MG PO TABS: 600.0000 mg | ORAL_TABLET | Freq: Four times a day (QID) | ORAL | 0 refills | Status: DC | PRN

## 2017-06-09 MED ORDER — IBUPROFEN 800 MG PO TABS
800.0000 mg | ORAL_TABLET | Freq: Once | ORAL | Status: AC
  Administered 2017-06-09: 800 mg via ORAL
  Filled 2017-06-09: qty 1

## 2017-06-09 NOTE — ED Triage Notes (Signed)
Pt presents with c/o left fifth toe pain. The pain began several days ago. He has been treating his left foot for athletes foot with lotrimin but has noticed increased skin growth around this pinky toe and over the past several days redness, pain and swelling.

## 2017-06-09 NOTE — Discharge Instructions (Signed)
Take Keflex four times daily for 7 days. Take ibuprofen as needed for pain. Return to ED for worsening pain, trouble walking, numbness, weakness, injury.

## 2017-06-09 NOTE — ED Notes (Signed)
Called for room, no answer

## 2017-06-09 NOTE — ED Notes (Signed)
Patient Alert and oriented X4. Stable and ambulatory. Patient verbalized understanding of the discharge instructions.  Patient belongings were taken by the patient.  

## 2017-06-09 NOTE — ED Provider Notes (Signed)
Did not evaluate patient   Albesa SeenWojeck, Robyn K, NP 06/09/17 1545

## 2017-06-09 NOTE — ED Notes (Signed)
Pt up to nurse first stating that he fell asleep and was unsure if we had called him, this RN advised patient that we called him at 1415 and were unable to locate him, pt states he would still like to be seen, this RN advised we would place him back in the system to be seen.

## 2017-06-09 NOTE — ED Provider Notes (Signed)
[facility] Provider Note   CSN: [id number] Arrival date & time: 06/09/17  1150     History   Chief Complaint Chief Complaint  Patient presents with  . Toe Pain    HPI Clayton Vaughan is a 26 y.o. male.  HPI  Patient presents to ED for evaluation of throbbing left pinky toe pain, redness and swelling that he states began 2 days ago. He is unsure if he hit the area on something. He states pain worse with weightbearing and pressure. Denies any previous injury or procedure done in the area. He is still ambulatory. He denies any nausea, vomiting, fevers, chills, numbness, recent injury.  Past Medical History:  Diagnosis Date  . Sickle cell trait (HCC)   . Sickle cell trait (HCC)     There are no active problems to display for this patient.   History reviewed. No pertinent surgical history.     Home Medications    Prior to Admission medications   Medication Sig Start Date End Date Taking? Authorizing Provider  amoxicillin (AMOXIL) 500 MG capsule Take 1 capsule (500 mg total) by mouth 3 (three) times daily. 05/23/17   Bethann BerkshireZammit, Joseph, MD  cephALEXin (KEFLEX) 500 MG capsule Take 1 capsule (500 mg total) by mouth 4 (four) times daily. 06/09/17 06/16/17  Traveon Louro, PA-C  ibuprofen (ADVIL,MOTRIN) 600 MG tablet Take 1 tablet (600 mg total) by mouth every 6 (six) hours as needed. 06/09/17   Abdinasir Spadafore, PA-C  traMADol (ULTRAM) 50 MG tablet Take 1 tablet (50 mg total) by mouth every 12 (twelve) hours as needed. 04/06/17   Naida Sleightwens, James M, PA-C    Family History History reviewed. No pertinent family history.  Social History Social History  Substance Use Topics  . Smoking status: Current Every Day Smoker    Packs/day: 1.00    Types: Cigarettes  . Smokeless tobacco: Never Used  . Alcohol use Yes     Allergies   Patient has no known allergies.   Review of Systems Review of Systems  Constitutional: Negative for chills and fever.  Gastrointestinal: Negative  for nausea and vomiting.  Musculoskeletal: Positive for arthralgias. Negative for gait problem.  Skin: Positive for color change.  Neurological: Negative for weakness and numbness.     Physical Exam Updated Vital Signs BP (!) 139/94   Pulse 75   Temp 97.6 F (36.4 C) (Oral)   Resp 14   SpO2 100%   Physical Exam  Constitutional: He appears well-developed and well-nourished. No distress.  HENT:  Head: Normocephalic and atraumatic.  Eyes: Conjunctivae and EOM are normal. No scleral icterus.  Neck: Normal range of motion.  Pulmonary/Chest: Effort normal. No respiratory distress.  Musculoskeletal:  Swelling, erythema of the left fifth digit with no visible bruising or foreign body noted. Full active and passive range of motion of the left foot and digits.  Neurological: He is alert.  Skin: No rash noted. He is not diaphoretic.  Psychiatric: He has a normal mood and affect.  Nursing note and vitals reviewed.    ED Treatments / Results  Labs (all labs ordered are listed, but only abnormal results are displayed) Labs Reviewed - No data to display  EKG  EKG Interpretation None       Radiology Dg Foot Complete Left  Result Date: 06/09/2017 CLINICAL DATA:  Left foot pain, no known injury, initial encounter EXAM: LEFT FOOT - COMPLETE 3+ VIEW COMPARISON:  None. FINDINGS: No acute fracture or dislocation is noted. Mild  soft tissue changes are seen consistent with the given clinical history. No other focal abnormality is noted. IMPRESSION: Soft tissue swelling without acute bony abnormality. Electronically Signed   By: Alcide Clever M.D.   On: 06/09/2017 18:41    Procedures Procedures (including critical care time)  Medications Ordered in ED Medications  ibuprofen (ADVIL,MOTRIN) tablet 800 mg (800 mg Oral Given 06/09/17 1912)     Initial Impression / Assessment and Plan / ED Course  I have reviewed the triage vital signs and the nursing notes.  Pertinent labs & imaging  results that were available during my care of the patient were reviewed by me and considered in my medical decision making (see chart for details).     Patient presents to ED for evaluation of left pinky toe pain that began 2 days ago. He is unsure if he had any injury to the area. He is still ambulatory and denies any fevers, chills, numbness, weakness or trouble walking. On physical exam his left pinky toe is swollen, erythematous. Sensation intact to light touch. He has full active and passive range of motion of the foot and digits. X-rays negative for bony abnormality. Symptoms most likely due to cellulitis. Low suspicion for septic joint or osteomyelitis based on physical exam findings and history. We'll treat with Keflex and advise him to take ibuprofen as needed for pain. Patient appears stable for discharge at this time. Strict return precautions given.  Final Clinical Impressions(s) / ED Diagnoses   Final diagnoses:  Toe infection    New Prescriptions New Prescriptions   CEPHALEXIN (KEFLEX) 500 MG CAPSULE    Take 1 capsule (500 mg total) by mouth 4 (four) times daily.   IBUPROFEN (ADVIL,MOTRIN) 600 MG TABLET    Take 1 tablet (600 mg total) by mouth every 6 (six) hours as needed.     Dietrich Pates, PA-C 06/09/17 1922    [address] Cindee Salt, MD 06/09/17 2026

## 2017-06-11 ENCOUNTER — Ambulatory Visit (HOSPITAL_COMMUNITY)
Admission: EM | Admit: 2017-06-11 | Discharge: 2017-06-11 | Disposition: A | Discharge status: Home / Self Care | Payer: Self-pay | Attending: Family Medicine | Admitting: Family Medicine

## 2017-06-11 ENCOUNTER — Encounter (HOSPITAL_COMMUNITY): Payer: Self-pay | Admitting: Emergency Medicine

## 2017-06-11 DIAGNOSIS — L03032 Cellulitis of left toe: Secondary | ICD-10-CM

## 2017-06-11 DIAGNOSIS — B353 Tinea pedis: Secondary | ICD-10-CM

## 2017-06-11 MED ORDER — HYDROCODONE-ACETAMINOPHEN 5-325 MG PO TABS: 1.0000 | ORAL_TABLET | ORAL | 0 refills | Status: DC | PRN

## 2017-06-11 NOTE — ED Triage Notes (Signed)
Here for wound check... [facility]  Was given Keflex and Ibup but still c/o "umbearable pain"  Has a cam walker on foot... Slow gait... A&O x4... NAD.

## 2017-06-11 NOTE — ED Provider Notes (Signed)
  [facility]   [id number] 06/11/17 Arrival Time: 1007  ASSESSMENT & PLAN:  1. Cellulitis of toe of left foot   2. Tinea pedis of left foot    You may use Domeboro Solution over-the-counter. Follow the directions on the box. Recheck here in 2 days. Continue taking your antibiotic.  Meds ordered this encounter  Medications  . HYDROcodone-acetaminophen (NORCO/VICODIN) 5-325 MG tablet    Sig: Take 1 tablet by mouth every 4 (four) hours as needed for severe pain.    Dispense:  12 tablet    Refill:  0   Be aware pain medications may cause drowsiness. Please do not drive, operate heavy machinery or make important decisions while on this medication, it can cloud your judgement.  Reviewed expectations re: course of current medical issues. Questions answered. Outlined signs and symptoms indicating need for more acute intervention. Patient verbalized understanding. After Visit Summary given.   SUBJECTIVE:  Clayton Vaughan is a 26 y.o. male who presents for wound check. Seen in ED 2 days ago and given Keflex for cellulitis of L 5th toe/foot. He reports skin redness is going down. Still experiencing significant pain. Affects sleep. Afebrile. Ambulatory with cam walker. Ibuprofen not helping with pain.  ROS: As per HPI.   OBJECTIVE:  Vitals:   06/11/17 1027  BP: 127/88  Pulse: 62  Resp: 20  Temp: 97.9 F (36.6 C)  TempSrc: Oral  SpO2: 100%     General appearance: alert; no distress Extremities: L dorsal, distal foot into 5th toe with erythema and swelling; no areas of fluctuance; warm and tender to touch; between 4th/5th toes is significant skin breakdown consistent with tinea  No Known Allergies  PMHx, SurgHx, SocialHx, Medications, and Allergies were reviewed in the Visit Navigator and updated as appropriate.      Mardella LaymanHagler, Kandon Hosking, MD 06/11/17 [phone]

## 2017-06-11 NOTE — Discharge Instructions (Signed)
You may use Domeboro Solution over-the-counter. Follow the directions on the box. Recheck here in 2 days. Continue taking your antibiotic.  Be aware, pain medications may cause drowsiness. Please do not drive, operate heavy machinery or make important decisions while on this medication, it can cloud your judgement.

## 2017-06-25 ENCOUNTER — Encounter (HOSPITAL_COMMUNITY): Payer: Self-pay | Admitting: *Deleted

## 2017-06-25 ENCOUNTER — Emergency Department (HOSPITAL_COMMUNITY)
Admission: EM | Admit: 2017-06-25 | Discharge: 2017-06-26 | Disposition: A | Discharge status: Home / Self Care | Payer: Self-pay | Attending: Emergency Medicine | Admitting: Emergency Medicine

## 2017-06-25 DIAGNOSIS — F1410 Cocaine abuse, uncomplicated: Secondary | ICD-10-CM | POA: Diagnosis present

## 2017-06-25 DIAGNOSIS — R451 Restlessness and agitation: Secondary | ICD-10-CM | POA: Insufficient documentation

## 2017-06-25 DIAGNOSIS — F17210 Nicotine dependence, cigarettes, uncomplicated: Secondary | ICD-10-CM | POA: Insufficient documentation

## 2017-06-25 DIAGNOSIS — F919 Conduct disorder, unspecified: Secondary | ICD-10-CM | POA: Insufficient documentation

## 2017-06-25 DIAGNOSIS — F69 Unspecified disorder of adult personality and behavior: Secondary | ICD-10-CM

## 2017-06-25 LAB — RAPID URINE DRUG SCREEN, HOSP PERFORMED
AMPHETAMINES: NOT DETECTED
BARBITURATES: NOT DETECTED
BENZODIAZEPINES: NOT DETECTED
Cocaine: POSITIVE — AB
Opiates: POSITIVE — AB
TETRAHYDROCANNABINOL: POSITIVE — AB

## 2017-06-25 LAB — COMPREHENSIVE METABOLIC PANEL
ALBUMIN: 3.8 g/dL (ref 3.5–5.0)
ALT: 23 U/L (ref 17–63)
AST: 29 U/L (ref 15–41)
Alkaline Phosphatase: 62 U/L (ref 38–126)
Anion gap: 7 (ref 5–15)
BUN: 7 mg/dL (ref 6–20)
CHLORIDE: 105 mmol/L (ref 101–111)
CO2: 29 mmol/L (ref 22–32)
CREATININE: 1.13 mg/dL (ref 0.61–1.24)
Calcium: 9.3 mg/dL (ref 8.9–10.3)
GFR calc Af Amer: >60 mL/min (ref >60)
GLUCOSE: 94 mg/dL (ref 65–99)
Potassium: 3.9 mmol/L (ref 3.5–5.1)
Sodium: 141 mmol/L (ref 135–145)
Total Bilirubin: 0.6 mg/dL (ref 0.3–1.2)
Total Protein: 6.8 g/dL (ref 6.5–8.1)

## 2017-06-25 LAB — SALICYLATE LEVEL: Salicylate Lvl: <7 mg/dL (ref 2.8–30.0)

## 2017-06-25 LAB — CBC
HEMATOCRIT: 41.1 % (ref 39.0–52.0)
HEMOGLOBIN: 14.3 g/dL (ref 13.0–17.0)
MCH: 28.2 pg (ref 26.0–34.0)
MCHC: 34.8 g/dL (ref 30.0–36.0)
MCV: 81.1 fL (ref 78.0–100.0)
Platelets: 221 10*3/uL (ref 150–400)
RBC: 5.07 MIL/uL (ref 4.22–5.81)
RDW: 13.9 % (ref 11.5–15.5)
WBC: 5.1 10*3/uL (ref 4.0–10.5)

## 2017-06-25 LAB — ETHANOL

## 2017-06-25 LAB — ACETAMINOPHEN LEVEL: Acetaminophen (Tylenol), Serum: <10 ug/mL — ABNORMAL LOW (ref 10–30)

## 2017-06-25 MED ORDER — NICOTINE 7 MG/24HR TD PT24
7.0000 mg | MEDICATED_PATCH | Freq: Every day | TRANSDERMAL | Status: DC
  Administered 2017-06-25 – 2017-06-26 (×2): 7 mg via TRANSDERMAL
  Filled 2017-06-25 (×3): qty 1

## 2017-06-25 NOTE — ED Notes (Signed)
Dinner tray given to pt. TTS at the bedside.

## 2017-06-25 NOTE — ED Triage Notes (Signed)
Pt reports having "rage" inside of him and having thoughts of harming others and possibly himself but denies any attempts. Is calm at triage.

## 2017-06-25 NOTE — ED Provider Notes (Signed)
[facility] Provider Note   CSN: [id number] Arrival date & time: 06/25/17  1033     History   Chief Complaint Chief Complaint  Patient presents with  . Homicidal    HPI Clayton Vaughan is a 26 y.o. male.  The history is provided by the patient and medical records. No language interpreter was used.   Clayton Vaughan is a 26 y.o. male  with a PMH of sickle cell trait who presents to the Emergency Department complaining of "rage" for the last 3-4 days. He denies any history of feeling this way in the past. He states that the feeling comes and goes, but when it occurs, he has thoughts of hurting himself and maybe someone else. He just gets a feeling he "can't describe but it scares me" He came to the ER "for my own safety" scared he would hurt himself. When asked about suicidal thoughts, he said not really, "I just punch things and get mad. Stuff that hurts me." He takes no daily medications. No hx of mental illness.   Past Medical History:  Diagnosis Date  . Sickle cell trait (HCC)   . Sickle cell trait (HCC)     There are no active problems to display for this patient.   History reviewed. No pertinent surgical history.     Home Medications    Prior to Admission medications   Medication Sig Start Date End Date Taking? Authorizing Provider  amoxicillin (AMOXIL) 500 MG capsule Take 1 capsule (500 mg total) by mouth 3 (three) times daily. 05/23/17   Bethann Berkshire, MD  HYDROcodone-acetaminophen (NORCO/VICODIN) 5-325 MG tablet Take 1 tablet by mouth every 4 (four) hours as needed for severe pain. 06/11/17   Mardella Layman, MD  ibuprofen (ADVIL,MOTRIN) 600 MG tablet Take 1 tablet (600 mg total) by mouth every 6 (six) hours as needed. 06/09/17   Khatri, Hina, PA-C  traMADol (ULTRAM) 50 MG tablet Take 1 tablet (50 mg total) by mouth every 12 (twelve) hours as needed. 04/06/17   Naida Sleight, PA-C    Family History History reviewed. No pertinent family  history.  Social History Social History  Substance Use Topics  . Smoking status: Current Every Day Smoker    Packs/day: 1.00    Types: Cigarettes  . Smokeless tobacco: Never Used  . Alcohol use Yes     Allergies   Patient has no known allergies.   Review of Systems Review of Systems  Psychiatric/Behavioral: Positive for agitation and suicidal ideas.  All other systems reviewed and are negative.    Physical Exam Updated Vital Signs BP 126/84 (BP Location: Left Arm)   Pulse 60   Temp 98 F (36.7 C) (Oral)   Resp 18   SpO2 99%   Physical Exam  Constitutional: He is oriented to person, place, and time. He appears well-developed and well-nourished. No distress.  HENT:  Head: Normocephalic and atraumatic.  Cardiovascular: Normal rate, regular rhythm and normal heart sounds.   No murmur heard. Pulmonary/Chest: Effort normal and breath sounds normal. No respiratory distress.  Abdominal: Soft. He exhibits no distension. There is no tenderness.  Musculoskeletal: Normal range of motion.  Neurological: He is alert and oriented to person, place, and time.  Skin: Skin is warm and dry.  Nursing note and vitals reviewed.    ED Treatments / Results  Labs (all labs ordered are listed, but only abnormal results are displayed) Labs Reviewed  ACETAMINOPHEN LEVEL - Abnormal; Notable for the following:  Result Value   Acetaminophen (Tylenol), Serum <10 (*)    All other components within normal limits  RAPID URINE DRUG SCREEN, HOSP PERFORMED - Abnormal; Notable for the following:    Opiates POSITIVE (*)    Cocaine POSITIVE (*)    Tetrahydrocannabinol POSITIVE (*)    All other components within normal limits  COMPREHENSIVE METABOLIC PANEL  ETHANOL  SALICYLATE LEVEL  CBC    EKG  EKG Interpretation None       Radiology No results found.  Procedures Procedures (including critical care time)  Medications Ordered in ED Medications  nicotine (NICODERM CQ -  dosed in mg/24 hr) patch 7 mg (not administered)     Initial Impression / Assessment and Plan / ED Course  I have reviewed the triage vital signs and the nursing notes.  Pertinent labs & imaging results that were available during my care of the patient were reviewed by me and considered in my medical decision making (see chart for details).    [name] is a 26 y.o. male who presents to ED for uncontrolled rage and concern for his safety. Labs reassuring. Medically cleared with dispo per TTS.    Final Clinical Impressions(s) / ED Diagnoses   Final diagnoses:  Rage attacks    New Prescriptions New Prescriptions   No medications on file     Ward, Chase PicketJaime Pilcher, PA-C 06/25/17 1741    Vanetta MuldersZackowski, Scott, MD 06/26/17 [phone]

## 2017-06-25 NOTE — ED Notes (Signed)
F/u with service response. Order received.  

## 2017-06-25 NOTE — ED Notes (Signed)
Dinner tray did not arrive with other orders. Service response called, will bring dinner tray ASAP.

## 2017-06-25 NOTE — ED Notes (Signed)
Dinner order placed 

## 2017-06-25 NOTE — ED Notes (Signed)
Pt is in scrubs and wanded at triage.

## 2017-06-25 NOTE — ED Notes (Signed)
Dietary called regarding pt lunch tray that never arrived. Per dietary, order received but not filled. Will send early dinner tray for pt.  

## 2017-06-25 NOTE — BH Assessment (Signed)
Tele Assessment Note   Clayton Vaughan is an 26 y.o. male [facility] with complaints of "rage" for the last 3-4 days. "He states that the feeling comes and goes, but when it occurs, he has thoughts of hurting himself and maybe someone else." The patient describes ongoing daily drug use: heroin, cocaine and cannabis, also drinks daily x2 beers. Describes having low depressed mood and at other times, "too much energy in my body."  Indicates that's how he feels right now and it scares him.  Denies SI but states he had thoughts eight years ago when his parents had a conflict. Burned himself once on the arm. Reports current stressor as conflict with the mother of his unborn child. The mother is due in 4 weeks.  Denies A/V or paranoia. 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 Reports current symptoms for 4-5 days. Diagnosed with ADHD as a teenager. History of selling drugs and past charges but none currently. Not on probation. Has panic attacks a few times a month, shortness of breath. Patient had unremarkable appearance, good eye contact, freedom of movement, logical speech, alert, pleasant mood, partial judgment and insight. Patient agrees to sign in voluntarily if inpatient was warranted.    Donell SievertSpencer Simon NP recommends inpatient psychiatric treatment.    Diagnosis: Substance induced mood disorder   Past Medical History:  Past Medical History:  Diagnosis Date  . Sickle cell trait (HCC)   . Sickle cell trait (HCC)     History reviewed. No pertinent surgical history.  Family History: History reviewed. No pertinent family history.  Social History:  reports that he has been smoking Cigarettes.  He has been smoking about 1.00 pack per day. He has never used smokeless tobacco. He reports that he drinks alcohol. He reports that he uses drugs, including Marijuana and Cocaine.  Additional Social History:  Alcohol / Drug Use Pain Medications: see MAR Prescriptions: see MAR Over the  Counter: see MAR History of alcohol / drug use?: Yes Substance #1 Name of Substance 1: heroin  1 - Age of First Use: 26 yr old 1 - Amount (size/oz): snort unknown amount 1 - Frequency: daily 1 - Duration: on and off, started daily use 2 months ago 1 - Last Use / Amount: yesterday Substance #2 Name of Substance 2: cocaine 2 - Age of First Use: 26 yr old 2 - Amount (size/oz): crack and powder  2 - Frequency: daily or every other day 2 - Duration: years 2 - Last Use / Amount: yesterday Substance #3 Name of Substance 3: cannabis 3 - Age of First Use: 26 yr old  3 - Amount (size/oz): 2 blunts 3 - Frequency: daily 3 - Last Use / Amount: unknown   CIWA: CIWA-Ar BP: 126/86 Pulse Rate: (!) 55 COWS:    PATIENT STRENGTHS: (choose at least two) Average or above average intelligence General fund of knowledge  Allergies: No Known Allergies  Home Medications:  (Not in a hospital admission)  OB/GYN Status:  No LMP for male patient.  General Assessment Data [facility] Assessment: In system Is this a Tele or Face-to-Face Assessment?: Tele Assessment Is this an Initial Assessment or a Re-assessment for this encounter?: Initial Assessment Marital status: Single Is patient pregnant?: No Pregnancy Status: No Admission Status: Voluntary Is patient capable of signing voluntary admission?: Yes Referral Source: Self/Family/Friend Insurance type: self pay  Medical Screening Exam [facility] Walk-in ONLY) Medical Exam completed: Yes  Crisis Care Plan  Name of Psychiatrist: n/a Name of Therapist: n/a  Education Status Is patient currently in school?: No Highest grade of school patient has completed: 11th grade- GED  Risk to self with the past 6 months Suicidal Ideation: No Has patient been a risk to self within the past 6 months prior to admission? : No Suicidal Intent: No Has patient had any suicidal intent within the past 6 months prior to admission? : No Is  patient at risk for suicide?: No Suicidal Plan?: No Has patient had any suicidal plan within the past 6 months prior to admission? : No Access to Means: No What has been your use of drugs/alcohol within the last 12 months?: cocaine, heroin, cannabis Previous Attempts/Gestures: No How many times?: 0 Other Self Harm Risks: 0 Intentional Self Injurious Behavior: Burning Comment - Self Injurious Behavior: burned arm Family Suicide History: No Recent stressful life event(s): Conflict (Comment) (girlfriend or exgf is having their baby in 4 weeks) Persecutory voices/beliefs?: No Depression: Yes Depression Symptoms: Loss of interest in usual pleasures, Feeling worthless/self pity, Feeling angry/irritable Substance abuse history and/or treatment for substance abuse?: Yes Suicide prevention information given to non-admitted patients: Not applicable  Risk to Others within the past 6 months Homicidal Ideation: Yes-Currently Present Does patient have any lifetime risk of violence toward others beyond the six months prior to admission? : No Thoughts of Harm to Others: Yes-Currently Present Comment - Thoughts of Harm to Others: no plan or intent Current Homicidal Intent: No Current Homicidal Plan: No Access to Homicidal Means: No History of harm to others?: No Assessment of Violence: None Noted Does patient have access to weapons?: No Criminal Charges Pending?: No Does patient have a court date: No Is patient on probation?: No  Psychosis Hallucinations: None noted (possible) Delusions: None noted (possible)  Mental Status Report Appearance/Hygiene: Unremarkable Eye Contact: Good Motor Activity: Freedom of movement Speech: Logical/coherent Level of Consciousness: Alert Mood: Pleasant, Euthymic Affect: Appropriate to circumstance Anxiety Level: Panic Attacks Panic attack frequency: 2 or 3 times a month Most recent panic attack: unknown  Thought Processes: Coherent, Relevant Judgement:  Partial Orientation: Person, Place, Time, Situation Obsessive Compulsive Thoughts/Behaviors: None  Cognitive Functioning Concentration: Normal Memory: Recent Intact, Remote Intact IQ: Average Insight: Fair Impulse Control: Fair Appetite: Good Weight Loss: 0 Weight Gain: 0 Sleep: Decreased Vegetative Symptoms: Staying in bed  ADLScreening [facility] Assessment Services) Patient's cognitive ability adequate to safely complete daily activities?: Yes Patient able to express need for assistance with ADLs?: Yes Independently performs ADLs?: Yes (appropriate for developmental age)  Prior Inpatient Therapy Prior Inpatient Therapy: No  Prior Outpatient Therapy Prior Outpatient Therapy: No Does patient have an ACCT team?: No Does patient have Intensive In-House Services?  : No Does patient have [facility] services? : No Does patient have P4CC services?: No  ADL Screening (condition at time of admission) Patient's cognitive ability adequate to safely complete daily activities?: Yes Is the patient deaf or have difficulty hearing?: No Does the patient have difficulty seeing, even when wearing glasses/contacts?: No Does the patient have difficulty concentrating, remembering, or making decisions?: No Patient able to express need for assistance with ADLs?: Yes Does the patient have difficulty dressing or bathing?: No Independently performs ADLs?: Yes (appropriate for developmental age)       Abuse/Neglect Assessment (Assessment to be complete while patient is alone) Physical Abuse:  (UTA) Verbal Abuse:  (UTA) Sexual Abuse:  (UTA)     Advance Directives (For Healthcare) Does Patient Have a Medical Advance Directive?: No  Additional Information 1:1 In Past 12 Months?: No CIRT Risk: No Elopement Risk: No Does patient have medical clearance?: No     Disposition:  Disposition Initial Assessment Completed for this Encounter: Yes Disposition of Patient: Other dispositions Other  disposition(s): Other (Comment)  Vonzell Schlatter [facility] 06/25/2017 8:24 PM

## 2017-06-26 DIAGNOSIS — F17210 Nicotine dependence, cigarettes, uncomplicated: Secondary | ICD-10-CM

## 2017-06-26 DIAGNOSIS — Z630 Problems in relationship with spouse or partner: Secondary | ICD-10-CM

## 2017-06-26 DIAGNOSIS — F1290 Cannabis use, unspecified, uncomplicated: Secondary | ICD-10-CM

## 2017-06-26 DIAGNOSIS — F909 Attention-deficit hyperactivity disorder, unspecified type: Secondary | ICD-10-CM

## 2017-06-26 DIAGNOSIS — F1410 Cocaine abuse, uncomplicated: Secondary | ICD-10-CM

## 2017-06-26 MED ORDER — ACETAMINOPHEN 325 MG PO TABS
650.0000 mg | ORAL_TABLET | Freq: Four times a day (QID) | ORAL | Status: DC | PRN
  Administered 2017-06-26: 650 mg via ORAL
  Filled 2017-06-26: qty 2

## 2017-06-26 NOTE — ED Notes (Signed)
Meal tray given, pt reports he is not hungry at this time. Tray left at bedside

## 2017-06-26 NOTE — ED Notes (Signed)
[facility], they report they may have to send someone to evaluate pt in person.

## 2017-06-26 NOTE — ED Notes (Signed)
Pt given belongings back from inventory and security. Pt verbalizes DC teaching and need [facility]. NAD. VSS

## 2017-06-26 NOTE — ED Notes (Signed)
TTS at Bedside 

## 2017-06-26 NOTE — Progress Notes (Signed)
Per Reola Calkinsravis Money, FNP, the patient does not meet criteria for inpatient treatment. Patient is recommended for discharge.   Pearson Grippeyndy Roughgarden, RN notified, and agreed to notify patient's assigned nurse, April HoldingJamie Bailey, RN.        Baldo DaubJolan Eimi Viney MSW, LCSWA CSW Disposition [phone]

## 2017-06-26 NOTE — Discharge Instructions (Signed)
[facility]. Return for any suicidal feelings or any feelings that sure going to hurt somebody.

## 2017-06-26 NOTE — ED Notes (Signed)
Breakfast order sent via email.  

## 2017-06-26 NOTE — Consult Note (Signed)
Telepsych Consultation   Reason for Consult:  Anger and rage feeling Referring Physician:  Dr. Rogene Houston Patient Identification: Clayton Vaughan MRN:  [id number] Principal Diagnosis: Cocaine abuse Diagnosis:   Patient Active Problem List   Diagnosis Date Noted  . Cocaine abuse [F14.10] 06/26/2017    Total Time spent with patient: 30 minutes  Subjective:   Clayton Vaughan is a 26 y.o. male patient admitted with anger and agitation. Patient reports daily use of cocaine prior to coming to the ED. He denies any anger or agitation during the day. He did admit minor agitation when being awakened this morning, but apologized to the staff at the hospital. He denies any SI/HI/AVH. He also admits that this had gotten worse while using cocaine, heroin, and cannabis. He agrees to follow up at [facility] for therapy or possible medications treatment. He again contracts for safety and states that he will return to the hospital if these feelings return.   Objective: Patient is pleasant and cooperative. He has logical and coherent thought. He is encouraged to refrain from drug use and that will be very beneficial in maintaining stable mental health.  HPI:  Clayton Vaughan is an 26 y.o. male presenting to [facility] with complaints of "rage" for the last 3-4 days. "He states that the feeling comes and goes, but when it occurs, he has thoughts of hurting himself and maybe someone else." The patient describes ongoing daily drug use: heroin, cocaine and cannabis, also drinks daily x2 beers. Describes having low depressed mood and at other times, "too much energy in my body."  Indicates that's how he feels right now and it scares him.  Denies SI but states he had thoughts eight years ago when his parents had a conflict. Burned himself once on the arm. Reports current stressor as conflict with the mother of his unborn child. The mother is due in 4 weeks.  Denies A/V or paranoia. 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 Reports current symptoms for 4-5 days. Diagnosed with ADHD as a teenager. History of selling drugs and past charges but none currently. Not on probation. Has panic attacks a few times a month, shortness of breath. Patient had unremarkable appearance, good eye contact, freedom of movement, logical speech, alert, pleasant mood, partial judgment and insight.  Past Psychiatric History: ADHD as a teeneager  Risk to Self: Suicidal Ideation: No Suicidal Intent: No Is patient at risk for suicide?: No Suicidal Plan?: No Access to Means: No What has been your use of drugs/alcohol within the last 12 months?: cocaine, heroin, cannabis How many times?: 0 Other Self Harm Risks: 0 Intentional Self Injurious Behavior: Burning Comment - Self Injurious Behavior: burned arm Risk to Others: Homicidal Ideation: Yes-Currently Present Thoughts of Harm to Others: Yes-Currently Present Comment - Thoughts of Harm to Others: no plan or intent Current Homicidal Intent: No Current Homicidal Plan: No Access to Homicidal Means: No History of harm to others?: No Assessment of Violence: None Noted Does patient have access to weapons?: No Criminal Charges Pending?: No Does patient have a court date: No Prior Inpatient Therapy: Prior Inpatient Therapy: No Prior Outpatient Therapy: Prior Outpatient Therapy: No Does patient have an ACCT team?: No Does patient have Intensive In-House Services?  : No Does patient have [facility] services? : No Does patient have P4CC services?: No  Past Medical History:  Past Medical History:  Diagnosis Date  . Sickle cell trait ([place])   . Sickle cell trait (  [place])    History reviewed. No pertinent surgical history. Family History: History reviewed. No pertinent family history. Family Psychiatric  History: None reported Social History:  History  Alcohol Use  . Yes     History  Drug Use  . Types: Marijuana, Cocaine    Social History   Social  History  . Marital status: Single    Spouse name: N/A  . Number of children: N/A  . Years of education: N/A   Social History Main Topics  . Smoking status: Current Every Day Smoker    Packs/day: 1.00    Types: Cigarettes  . Smokeless tobacco: Never Used  . Alcohol use Yes  . Drug use: Yes    Types: Marijuana, Cocaine  . Sexual activity: Not Asked   Other Topics Concern  . None   Social History Narrative  . None   Additional Social History:    Allergies:  No Known Allergies  Labs:  Results for orders placed or performed during the hospital encounter of 06/25/17 (from the past 48 hour(s))  Comprehensive metabolic panel     Status: None   Collection Time: 06/25/17  1:50 PM  Result Value Ref Range   Sodium 141 135 - 145 mmol/L   Potassium 3.9 3.5 - 5.1 mmol/L   Chloride 105 101 - 111 mmol/L   CO2 29 22 - 32 mmol/L   Glucose, Bld 94 65 - 99 mg/dL   BUN 7 6 - 20 mg/dL   Creatinine, Ser 1.13 0.61 - 1.24 mg/dL   Calcium 9.3 8.9 - 10.3 mg/dL   Total Protein 6.8 6.5 - 8.1 g/dL   Albumin 3.8 3.5 - 5.0 g/dL   AST 29 15 - 41 U/L   ALT 23 17 - 63 U/L   Alkaline Phosphatase 62 38 - 126 U/L   Total Bilirubin 0.6 0.3 - 1.2 mg/dL   GFR calc non Af Amer >60 >60 mL/min   GFR calc Af Amer >60 >60 mL/min    Comment: (NOTE) The eGFR has been calculated using the CKD EPI equation. This calculation has not been validated in all clinical situations. eGFR's persistently <60 mL/min signify possible Chronic Kidney Disease.    Anion gap 7 5 - 15  Ethanol     Status: None   Collection Time: 06/25/17  1:50 PM  Result Value Ref Range   Alcohol, Ethyl (B) <5 <5 mg/dL    Comment:        LOWEST DETECTABLE LIMIT FOR SERUM ALCOHOL IS 5 mg/dL FOR MEDICAL PURPOSES ONLY   Salicylate level     Status: None   Collection Time: 06/25/17  1:50 PM  Result Value Ref Range   Salicylate Lvl <5.0 2.8 - 30.0 mg/dL  Acetaminophen level     Status: Abnormal   Collection Time: 06/25/17  1:50 PM   Result Value Ref Range   Acetaminophen (Tylenol), Serum <10 (L) 10 - 30 ug/mL    Comment:        THERAPEUTIC CONCENTRATIONS VARY SIGNIFICANTLY. A RANGE OF 10-30 ug/mL MAY BE AN EFFECTIVE CONCENTRATION FOR MANY PATIENTS. HOWEVER, SOME ARE BEST TREATED AT CONCENTRATIONS OUTSIDE THIS RANGE. ACETAMINOPHEN CONCENTRATIONS >150 ug/mL AT 4 HOURS AFTER INGESTION AND >50 ug/mL AT 12 HOURS AFTER INGESTION ARE OFTEN ASSOCIATED WITH TOXIC REACTIONS.   cbc     Status: None   Collection Time: 06/25/17  1:50 PM  Result Value Ref Range   WBC 5.1 4.0 - 10.5 K/uL   RBC 5.07 4.22 - 5.81  MIL/uL   Hemoglobin 14.3 13.0 - 17.0 g/dL   HCT 41.1 39.0 - 52.0 %   MCV 81.1 78.0 - 100.0 fL   MCH 28.2 26.0 - 34.0 pg   MCHC 34.8 30.0 - 36.0 g/dL   RDW 13.9 11.5 - 15.5 %   Platelets 221 150 - 400 K/uL  Rapid urine drug screen (hospital performed)     Status: Abnormal   Collection Time: 06/25/17  1:52 PM  Result Value Ref Range   Opiates POSITIVE (A) NONE DETECTED   Cocaine POSITIVE (A) NONE DETECTED   Benzodiazepines NONE DETECTED NONE DETECTED   Amphetamines NONE DETECTED NONE DETECTED   Tetrahydrocannabinol POSITIVE (A) NONE DETECTED   Barbiturates NONE DETECTED NONE DETECTED    Comment:        DRUG SCREEN FOR MEDICAL PURPOSES ONLY.  IF CONFIRMATION IS NEEDED FOR ANY PURPOSE, NOTIFY LAB WITHIN 5 DAYS.        LOWEST DETECTABLE LIMITS FOR URINE DRUG SCREEN Drug Class       Cutoff (ng/mL) Amphetamine      1000 Barbiturate      200 Benzodiazepine   202 Tricyclics       542 Opiates          300 Cocaine          300 THC              50     Current Facility-Administered Medications  Medication Dose Route Frequency Provider Last Rate Last Dose  . acetaminophen (TYLENOL) tablet 650 mg  650 mg Oral Q6H PRN Fredia Sorrow, MD   650 mg at 06/26/17 1002  . nicotine (NICODERM CQ - dosed in mg/24 hr) patch 7 mg  7 mg Transdermal Daily Ward, Ozella Almond, PA-C   7 mg at 06/26/17 7062   Current  Outpatient Prescriptions  Medication Sig Dispense Refill  . amoxicillin (AMOXIL) 500 MG capsule Take 1 capsule (500 mg total) by mouth 3 (three) times daily. (Patient not taking: Reported on 06/25/2017) 21 capsule 0  . HYDROcodone-acetaminophen (NORCO/VICODIN) 5-325 MG tablet Take 1 tablet by mouth every 4 (four) hours as needed for severe pain. (Patient not taking: Reported on 06/25/2017) 12 tablet 0  . ibuprofen (ADVIL,MOTRIN) 600 MG tablet Take 1 tablet (600 mg total) by mouth every 6 (six) hours as needed. (Patient not taking: Reported on 06/25/2017) 30 tablet 0  . traMADol (ULTRAM) 50 MG tablet Take 1 tablet (50 mg total) by mouth every 12 (twelve) hours as needed. (Patient not taking: Reported on 06/25/2017) 20 tablet 0    Musculoskeletal: Strength & Muscle Tone: within normal limits Gait & Station: normal Patient leans: N/A  Psychiatric Specialty Exam: Physical Exam  Nursing note and vitals reviewed. Constitutional: He is oriented to person, place, and time. He appears well-developed and well-nourished.  Cardiovascular: Normal rate.   Musculoskeletal: Normal range of motion.  Neurological: He is alert and oriented to person, place, and time.    Review of Systems  Constitutional: Negative.   HENT: Negative.   Eyes: Negative.   Respiratory: Negative.   Cardiovascular: Negative.   Gastrointestinal: Negative.   Genitourinary: Negative.   Musculoskeletal: Negative.   Skin: Negative.   Neurological: Negative.   Endo/Heme/Allergies: Negative.     Blood pressure (!) 135/96, pulse 66, temperature 98.4 F (36.9 C), temperature source Oral, resp. rate 18, SpO2 100 %.There is no height or weight on file to calculate BMI.  General Appearance: Casual  Eye Contact:  Good  Speech:  Clear and Coherent and Normal Rate  Volume:  Normal  Mood:  Depressed  Affect:  Flat  Thought Process:  Coherent and Descriptions of Associations: Intact  Orientation:  Full (Time, Place, and Person)  Thought  Content:  WDL  Suicidal Thoughts:  No  Homicidal Thoughts:  No  Memory:  Immediate;   Good Recent;   Good  Judgement:  Good  Insight:  Good  Psychomotor Activity:  Normal  Concentration:  Concentration: Good and Attention Span: Good  Recall:  Good  Fund of Knowledge:  Good  Language:  Good  Akathisia:  No  Handed:  Right  AIMS (if indicated):     Assets:  Housing Social Support  ADL's:  Intact  Cognition:  WNL  Sleep:        Treatment Plan Summary: Provide patient with outpatient provider services ([place], [facility]) Refrain from substance abuse  Disposition: No evidence of imminent risk to self or others at present.   Patient does not meet criteria for psychiatric inpatient admission.  [place], FNP 06/26/2017 5:24 PM

## 2017-06-26 NOTE — ED Notes (Signed)
TTS at bedside. 

## 2017-09-04 ENCOUNTER — Emergency Department (HOSPITAL_COMMUNITY)
Admission: EM | Admit: 2017-09-04 | Discharge: 2017-09-05 | Disposition: A | Discharge status: Home / Self Care | Payer: Self-pay | Attending: Emergency Medicine | Admitting: Emergency Medicine

## 2017-09-04 ENCOUNTER — Encounter (HOSPITAL_COMMUNITY): Payer: Self-pay | Admitting: Registered Nurse

## 2017-09-04 DIAGNOSIS — F1994 Other psychoactive substance use, unspecified with psychoactive substance-induced mood disorder: Secondary | ICD-10-CM

## 2017-09-04 DIAGNOSIS — R443 Hallucinations, unspecified: Secondary | ICD-10-CM

## 2017-09-04 DIAGNOSIS — R442 Other hallucinations: Secondary | ICD-10-CM | POA: Insufficient documentation

## 2017-09-04 DIAGNOSIS — F17210 Nicotine dependence, cigarettes, uncomplicated: Secondary | ICD-10-CM | POA: Insufficient documentation

## 2017-09-04 DIAGNOSIS — D573 Sickle-cell trait: Secondary | ICD-10-CM | POA: Insufficient documentation

## 2017-09-04 DIAGNOSIS — F1910 Other psychoactive substance abuse, uncomplicated: Secondary | ICD-10-CM

## 2017-09-04 DIAGNOSIS — Z79899 Other long term (current) drug therapy: Secondary | ICD-10-CM | POA: Insufficient documentation

## 2017-09-04 DIAGNOSIS — R440 Auditory hallucinations: Secondary | ICD-10-CM

## 2017-09-04 LAB — COMPREHENSIVE METABOLIC PANEL
ALBUMIN: 4.2 g/dL (ref 3.5–5.0)
ALK PHOS: 61 U/L (ref 38–126)
ALT: 18 U/L (ref 17–63)
ANION GAP: 9 (ref 5–15)
AST: 61 U/L — ABNORMAL HIGH (ref 15–41)
BUN: 7 mg/dL (ref 6–20)
CALCIUM: 8.9 mg/dL (ref 8.9–10.3)
CO2: 27 mmol/L (ref 22–32)
Chloride: 102 mmol/L (ref 101–111)
Creatinine, Ser: 1.06 mg/dL (ref 0.61–1.24)
GFR calc non Af Amer: >60 mL/min (ref >60)
GLUCOSE: 101 mg/dL — AB (ref 65–99)
POTASSIUM: 3.6 mmol/L (ref 3.5–5.1)
SODIUM: 138 mmol/L (ref 135–145)
Total Bilirubin: 0.8 mg/dL (ref 0.3–1.2)
Total Protein: 7 g/dL (ref 6.5–8.1)

## 2017-09-04 LAB — SALICYLATE LEVEL

## 2017-09-04 LAB — RAPID URINE DRUG SCREEN, HOSP PERFORMED
Amphetamines: NOT DETECTED
BARBITURATES: NOT DETECTED
BENZODIAZEPINES: NOT DETECTED
COCAINE: POSITIVE — AB
OPIATES: NOT DETECTED
Tetrahydrocannabinol: POSITIVE — AB

## 2017-09-04 LAB — CBC
HCT: 40.3 % (ref 39.0–52.0)
Hemoglobin: 13.4 g/dL (ref 13.0–17.0)
MCH: 27.3 pg (ref 26.0–34.0)
MCHC: 33.3 g/dL (ref 30.0–36.0)
MCV: 82.2 fL (ref 78.0–100.0)
PLATELETS: 244 10*3/uL (ref 150–400)
RBC: 4.9 MIL/uL (ref 4.22–5.81)
RDW: 15.9 % — AB (ref 11.5–15.5)
WBC: 4.7 10*3/uL (ref 4.0–10.5)

## 2017-09-04 LAB — ETHANOL: Alcohol, Ethyl (B): <10 mg/dL (ref <10)

## 2017-09-04 LAB — ACETAMINOPHEN LEVEL

## 2017-09-04 MED ORDER — ONDANSETRON HCL 4 MG PO TABS: 4.0000 mg | ORAL_TABLET | Freq: Three times a day (TID) | ORAL | Status: DC | PRN

## 2017-09-04 MED ORDER — ALUM & MAG HYDROXIDE-SIMETH 200-200-20 MG/5ML PO SUSP: 30.0000 mL | Freq: Four times a day (QID) | ORAL | Status: DC | PRN

## 2017-09-04 MED ORDER — ZOLPIDEM TARTRATE 5 MG PO TABS: 5.0000 mg | ORAL_TABLET | Freq: Every evening | ORAL | Status: DC | PRN

## 2017-09-04 NOTE — ED Notes (Signed)
Pt mumbles and at times will barely acknowledge you. He did clearly state that he is having visual and auditory hallucinations.  Pt states "I see shadows and they growl at me".

## 2017-09-04 NOTE — ED Provider Notes (Signed)
[facility]   CSN: [id number] Arrival date & time: 09/04/17  [id number]     History   Chief Complaint Chief Complaint  Patient presents with  . Medical Clearance  . Homicidal  . Hallucinations    HPI [name] is a 26 y.o. male.  The history is provided by the patient. No language interpreter was used.  Mental Health Problem  Presenting symptoms: hallucinations   Degree of incapacity (severity):  Moderate Progression:  Worsening Context: alcohol use and drug abuse   Treatment compliance:  Untreated Relieved by:  Nothing Worsened by:  Nothing Associated symptoms: no abdominal pain   Pt reports he does not trust people.  Per RN pt states he is having hallucinations.   Past Medical History:  Diagnosis Date  . Sickle cell trait (HCC)   . Sickle cell trait [facility](HCC)     Patient Active Problem List   Diagnosis Date Noted  . Cocaine abuse (HCC) 06/26/2017    No past surgical history on file.     Home Medications    Prior to Admission medications   Medication Sig Start Date End Date Taking? Authorizing Provider  amoxicillin (AMOXIL) 500 MG capsule Take 1 capsule (500 mg total) by mouth 3 (three) times daily. Patient not taking: Reported on 06/25/2017 05/23/17   Bethann BerkshireZammit, Joseph, MD  HYDROcodone-acetaminophen (NORCO/VICODIN) 5-325 MG tablet Take 1 tablet by mouth every 4 (four) hours as needed for severe pain. Patient not taking: Reported on 06/25/2017 06/11/17   Mardella LaymanHagler, Brian, MD  ibuprofen (ADVIL,MOTRIN) 600 MG tablet Take 1 tablet (600 mg total) by mouth every 6 (six) hours as needed. Patient not taking: Reported on 06/25/2017 06/09/17   Dietrich PatesKhatri, Hina, PA-C  traMADol (ULTRAM) 50 MG tablet Take 1 tablet (50 mg total) by mouth every 12 (twelve) hours as needed. Patient not taking: Reported on 06/25/2017 04/06/17   Naida Sleightwens, James M, PA-C    Family History No family history on file.  Social History Social History    Substance Use Topics  . Smoking status: Current Every Day Smoker    Packs/day: 1.00    Types: Cigarettes  . Smokeless tobacco: Never Used  . Alcohol use Yes     Allergies   Patient has no known allergies.   Review of Systems Review of Systems  Gastrointestinal: Negative for abdominal pain.  Psychiatric/Behavioral: Positive for hallucinations.  All other systems reviewed and are negative.    Physical Exam Updated Vital Signs BP 118/81 (BP Location: Right Arm)   Pulse 64   Temp (!) 97.4 F (36.3 C) (Oral)   Resp 16   Ht 6' 1.5" (1.867 m)   Wt 82.6 kg (182 lb)   SpO2 100%   BMI 23.69 kg/m   Physical Exam  Constitutional: He appears well-developed and well-nourished.  HENT:  Head: Normocephalic and atraumatic.  Eyes: Conjunctivae are normal.  Neck: Neck supple.  Cardiovascular: Normal rate and regular rhythm.   No murmur heard. Pulmonary/Chest: Effort normal and breath sounds normal. No respiratory distress.  Abdominal: Soft. There is no tenderness.  Musculoskeletal: He exhibits no edema.  Neurological: He is alert.  Skin: Skin is warm and dry.  Psychiatric: He has a normal mood and affect.  Nursing note and vitals reviewed.    ED Treatments / Results  Labs (all labs ordered are listed, but only abnormal results are displayed) Labs Reviewed  COMPREHENSIVE METABOLIC PANEL - Abnormal; Notable for the following:  Result Value   Glucose, Bld 101 (*)    AST 61 (*)    All other components within normal limits  ACETAMINOPHEN LEVEL - Abnormal; Notable for the following:    Acetaminophen (Tylenol), Serum <10 (*)    All other components within normal limits  CBC - Abnormal; Notable for the following:    RDW 15.9 (*)    All other components within normal limits  RAPID URINE DRUG SCREEN, HOSP PERFORMED - Abnormal; Notable for the following:    Cocaine POSITIVE (*)    Tetrahydrocannabinol POSITIVE (*)    All other components within normal limits  ETHANOL   SALICYLATE LEVEL    EKG  EKG Interpretation None       Radiology No results found.  Procedures Procedures (including critical care time)  Medications Ordered in ED Medications - No data to display   Initial Impression / Assessment and Plan / ED Course  I have reviewed the triage vital signs and the nursing notes.  Pertinent labs & imaging results that were available during my care of the patient were reviewed by me and considered in my medical decision making (see chart for details).     TTS to consult on pt.  Drug schedule positive for cocaine and THC.   Final Clinical Impressions(s) / ED Diagnoses   Final diagnoses:  Hallucinations    New Prescriptions New Prescriptions   No medications on file     Osie Cheeks 09/04/17 1717    Donnetta Hutching, MD 09/06/17 1157

## 2017-09-04 NOTE — ED Notes (Signed)
Pt valuables to security Pt other items inventoried
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
## 2017-09-04 NOTE — Progress Notes (Signed)
Patient meets criteria for inpatient treatment. CSW faxed referrals to the following inpatient facilities for review:  [facility], [facility], [facility], [facility], [facility], [facility], [facility], [facility], [facility]  TTS will continue to seek bed placement.   Art Levan MSW, LCSWA CSW Disposition [phone]  

## 2017-09-04 NOTE — Consult Note (Signed)
Records and medication reviewed; consulted with Dr Lucianne MussKumar Recommendation start Haldol 5 mg Q hs for psychosis.  Reassess tomorrow.

## 2017-09-04 NOTE — ED Triage Notes (Signed)
Pt mumbling -- difficult to hear-- admits to "wanting to hurt others, not myself-- " pt states he is hearing voices, "I want to check back into the psych part again"

## 2017-09-05 ENCOUNTER — Encounter (HOSPITAL_COMMUNITY): Payer: Self-pay | Admitting: Registered Nurse

## 2017-09-05 DIAGNOSIS — R440 Auditory hallucinations: Secondary | ICD-10-CM

## 2017-09-05 NOTE — ED Notes (Signed)
Pt went to take a shower  

## 2017-09-05 NOTE — ED Notes (Signed)
TTS talking to patient now.

## 2017-09-05 NOTE — ED Notes (Signed)
Pt requested a coffee and pt was given decaf coffee

## 2017-09-05 NOTE — Discharge Instructions (Signed)
You were seen for seeing things that were not there. You have been cleared by psychiatry. You need follow-up as an outpatient. If you develop any homicidal, suicidal, worsening hallucinations, you should be reevaluated.
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
## 2017-09-05 NOTE — BH Assessment (Signed)
[facility] Assessment Progress Note  Per Nira ConnJason Berry, NP pt does not meet inpt criteria and is stable for d/c. TTS spoke with pt's nurse Pecinich, Peggye LeyBrittany L, RN to advise of disposition and to obtain fax number in order to provide OPT resources. TTS faxed mental health resources to [phone] for the pt to follow up with in order to establish OPT care. Dr. Wilkie AyeHorton, MD contacted and notified of disposition.  Princess BruinsAquicha Brice Potteiger, MSW, LCSW Therapeutic Triage Specialist  [phone]

## 2017-09-05 NOTE — ED Provider Notes (Signed)
Received a call from TTS. Patient was reevaluated and felt to be stable for discharge home. This was discussed with Dr. Lucianne MussKumar, psychiatry. See note in the chart. They will fax over outpatient resources.  Patient currently denying any hallucinations, suicidal ideation, or homicidal ideation. Will discharge him with resources provided by psychiatry.   Shon BatonHorton, Hollis Oh F, MD 09/05/17 2318

## 2017-09-05 NOTE — ED Notes (Signed)
Pt is asking for a snack now. He is aware if he gets a snack now he won't get one at 9pm.  Pt requesting graham crackers and peanut butter. Pt given the same with decaff coffee.

## 2017-09-05 NOTE — ED Notes (Signed)
Belongings returned to patient.  Patient upset that people went through his stuff.  Explained that we have to catalog everything to make sure he gets all his stuff back.

## 2017-09-09 ENCOUNTER — Emergency Department (HOSPITAL_COMMUNITY)
Admission: EM | Admit: 2017-09-09 | Discharge: 2017-09-11 | Disposition: A | Discharge status: Home / Self Care | Payer: Self-pay | Attending: Emergency Medicine | Admitting: Emergency Medicine

## 2017-09-09 ENCOUNTER — Encounter (HOSPITAL_COMMUNITY): Payer: Self-pay

## 2017-09-09 DIAGNOSIS — Z23 Encounter for immunization: Secondary | ICD-10-CM | POA: Insufficient documentation

## 2017-09-09 DIAGNOSIS — F17210 Nicotine dependence, cigarettes, uncomplicated: Secondary | ICD-10-CM | POA: Insufficient documentation

## 2017-09-09 DIAGNOSIS — B351 Tinea unguium: Secondary | ICD-10-CM | POA: Insufficient documentation

## 2017-09-09 DIAGNOSIS — F22 Delusional disorders: Secondary | ICD-10-CM | POA: Insufficient documentation

## 2017-09-09 DIAGNOSIS — R440 Auditory hallucinations: Secondary | ICD-10-CM | POA: Insufficient documentation

## 2017-09-09 DIAGNOSIS — Z590 Homelessness: Secondary | ICD-10-CM | POA: Insufficient documentation

## 2017-09-09 DIAGNOSIS — F1410 Cocaine abuse, uncomplicated: Secondary | ICD-10-CM | POA: Insufficient documentation

## 2017-09-09 DIAGNOSIS — S90829A Blister (nonthermal), unspecified foot, initial encounter: Secondary | ICD-10-CM | POA: Insufficient documentation

## 2017-09-09 DIAGNOSIS — Y999 Unspecified external cause status: Secondary | ICD-10-CM | POA: Insufficient documentation

## 2017-09-09 DIAGNOSIS — Y9301 Activity, walking, marching and hiking: Secondary | ICD-10-CM | POA: Insufficient documentation

## 2017-09-09 DIAGNOSIS — X509XXA Other and unspecified overexertion or strenuous movements or postures, initial encounter: Secondary | ICD-10-CM | POA: Insufficient documentation

## 2017-09-09 DIAGNOSIS — Y929 Unspecified place or not applicable: Secondary | ICD-10-CM | POA: Insufficient documentation

## 2017-09-09 DIAGNOSIS — F1994 Other psychoactive substance use, unspecified with psychoactive substance-induced mood disorder: Secondary | ICD-10-CM | POA: Insufficient documentation

## 2017-09-09 DIAGNOSIS — F1910 Other psychoactive substance abuse, uncomplicated: Secondary | ICD-10-CM | POA: Insufficient documentation

## 2017-09-09 LAB — CBC WITH DIFFERENTIAL/PLATELET
BASOS ABS: 0 10*3/uL (ref 0.0–0.1)
BASOS PCT: 0 %
EOS ABS: 0.1 10*3/uL (ref 0.0–0.7)
EOS PCT: 3 %
HCT: 43.3 % (ref 39.0–52.0)
Hemoglobin: 14.9 g/dL (ref 13.0–17.0)
LYMPHS ABS: 2.3 10*3/uL (ref 0.7–4.0)
LYMPHS PCT: 44 %
MCH: 28.7 pg (ref 26.0–34.0)
MCHC: 34.4 g/dL (ref 30.0–36.0)
MCV: 83.4 fL (ref 78.0–100.0)
MONO ABS: 0.4 10*3/uL (ref 0.1–1.0)
Monocytes Relative: 8 %
Neutro Abs: 2.3 10*3/uL (ref 1.7–7.7)
Neutrophils Relative %: 45 %
PLATELETS: 246 10*3/uL (ref 150–400)
RBC: 5.19 MIL/uL (ref 4.22–5.81)
RDW: 15.6 % — AB (ref 11.5–15.5)
WBC: 5.2 10*3/uL (ref 4.0–10.5)

## 2017-09-09 LAB — COMPREHENSIVE METABOLIC PANEL
ALBUMIN: 4.3 g/dL (ref 3.5–5.0)
ALT: 23 U/L (ref 17–63)
ANION GAP: 6 (ref 5–15)
AST: 31 U/L (ref 15–41)
Alkaline Phosphatase: 71 U/L (ref 38–126)
BILIRUBIN TOTAL: 0.7 mg/dL (ref 0.3–1.2)
BUN: 9 mg/dL (ref 6–20)
CHLORIDE: 106 mmol/L (ref 101–111)
CO2: 28 mmol/L (ref 22–32)
Calcium: 9.5 mg/dL (ref 8.9–10.3)
Creatinine, Ser: 1.21 mg/dL (ref 0.61–1.24)
GFR calc Af Amer: >60 mL/min (ref >60)
GFR calc non Af Amer: >60 mL/min (ref >60)
GLUCOSE: 106 mg/dL — AB (ref 65–99)
POTASSIUM: 3.8 mmol/L (ref 3.5–5.1)
Sodium: 140 mmol/L (ref 135–145)
TOTAL PROTEIN: 7.4 g/dL (ref 6.5–8.1)

## 2017-09-09 LAB — RAPID URINE DRUG SCREEN, HOSP PERFORMED
Amphetamines: NOT DETECTED
BARBITURATES: NOT DETECTED
BENZODIAZEPINES: NOT DETECTED
COCAINE: POSITIVE — AB
Opiates: NOT DETECTED
Tetrahydrocannabinol: NOT DETECTED

## 2017-09-09 LAB — ETHANOL

## 2017-09-09 MED ORDER — TERBINAFINE HCL 250 MG PO TABS: 250.0000 mg | ORAL_TABLET | Freq: Every day | ORAL | 0 refills | Status: DC

## 2017-09-09 MED ORDER — ONDANSETRON HCL 4 MG PO TABS: 4.0000 mg | ORAL_TABLET | Freq: Three times a day (TID) | ORAL | Status: DC | PRN

## 2017-09-09 MED ORDER — CEPHALEXIN 250 MG PO CAPS: 500.0000 mg | ORAL_CAPSULE | Freq: Four times a day (QID) | ORAL | Status: DC

## 2017-09-09 MED ORDER — CEPHALEXIN 500 MG PO CAPS: 500.0000 mg | ORAL_CAPSULE | Freq: Four times a day (QID) | ORAL | 0 refills | Status: DC

## 2017-09-09 MED ORDER — ACETAMINOPHEN 325 MG PO TABS
650.0000 mg | ORAL_TABLET | ORAL | Status: DC | PRN
  Administered 2017-09-09 – 2017-09-10 (×2): 650 mg via ORAL
  Filled 2017-09-09 (×2): qty 2

## 2017-09-09 MED ORDER — TETANUS-DIPHTH-ACELL PERTUSSIS 5-2.5-18.5 LF-MCG/0.5 IM SUSP
0.5000 mL | Freq: Once | INTRAMUSCULAR | Status: AC
  Administered 2017-09-09: 0.5 mL via INTRAMUSCULAR
  Filled 2017-09-09: qty 0.5

## 2017-09-09 MED ORDER — CEPHALEXIN 250 MG PO CAPS
500.0000 mg | ORAL_CAPSULE | Freq: Once | ORAL | Status: AC
  Administered 2017-09-09: 500 mg via ORAL
  Filled 2017-09-09: qty 2

## 2017-09-09 MED ORDER — TERBINAFINE HCL 250 MG PO TABS
250.0000 mg | ORAL_TABLET | Freq: Every day | ORAL | Status: DC
  Administered 2017-09-09: 250 mg via ORAL
  Filled 2017-09-09: qty 1

## 2017-09-09 MED ORDER — TERBINAFINE HCL 250 MG PO TABS
250.0000 mg | ORAL_TABLET | Freq: Every day | ORAL | Status: DC
  Administered 2017-09-10 – 2017-09-11 (×2): 250 mg via ORAL
  Filled 2017-09-09 (×2): qty 1

## 2017-09-09 MED ORDER — TERBINAFINE HCL 250 MG PO TABS: 250.0000 mg | ORAL_TABLET | Freq: Every day | ORAL | Status: DC

## 2017-09-09 MED ORDER — NICOTINE 21 MG/24HR TD PT24
21.0000 mg | MEDICATED_PATCH | Freq: Every day | TRANSDERMAL | Status: DC
  Administered 2017-09-09 – 2017-09-10 (×2): 21 mg via TRANSDERMAL
  Filled 2017-09-09 (×2): qty 1

## 2017-09-09 MED ORDER — CEPHALEXIN 250 MG PO CAPS
500.0000 mg | ORAL_CAPSULE | Freq: Four times a day (QID) | ORAL | Status: DC
  Administered 2017-09-09 – 2017-09-11 (×6): 500 mg via ORAL
  Filled 2017-09-09 (×6): qty 2

## 2017-09-09 NOTE — ED Triage Notes (Signed)
Patient complains of bilateral foot pain, blisters to bilateral bottom of feet x 3 days.

## 2017-09-09 NOTE — BH Assessment (Signed)
Tele Assessment Note   Patient Name: Clayton Vaughan MRN: [id number] Referring Physician: Delia ChimesMurray, Alyssa B, PA-C Location of Patient: [facility] Location of Provider: Behavioral Health TTS Department  Clayton Vaughan is an 26 y.o. male presented to the ED after recent HI and complaints of hallucinations. Patient remained in the ED and was evaluated daily. Patient was discharged on 09/05/17, stable for d/c and provided resources. The patient presents today with a medical complaint but also reports HI with a plan to torture someone by cutting off their feet, states last thought was last night. Had thoughts toward someone he knows and someone he didn't know.  Denies having any weapons but reports he doesn't need weapons to carry out the act. Reports, "I'm gods son. I took an oath." Also reports visual hallucinations, "I see spirits and demons." The patient denies any mental health treatment in the past. Has not followed up with treatment since his discharge on 09/05/17.  Denies SI currently but admits to recent thoughts. The patient regularly abuses alcohol and cocaine. Last use was yesterday.   The patient is homeless. Denies any support. Laughed inappropriately at times. The patient was disheveled, had fair eye contact, fair eye contact, freedom of movement, logical speech, alert, depressed mood and affect, minimal anxiety, impaired judgment and insight, oriented x4.  Leighton Ruffina Okonkwo, NP recommends inpatient treatment. TTS to look for placement.   Diagnosis: Psychotic disorder  Past Medical History:  Past Medical History:  Diagnosis Date  . Sickle cell trait ([facility])   . Sickle cell trait ([facility])     History reviewed. No pertinent surgical history.  Family History: No family history on file.  Social History:  reports that he has been smoking Cigarettes.  He has been smoking about 1.00 pack per day. He has never used smokeless tobacco. He reports that he drinks alcohol. He reports that he  uses drugs, including Marijuana and Cocaine.  Additional Social History:  Alcohol / Drug Use Pain Medications: see MAR Prescriptions: see MAR Over the Counter: see MAR Substance #1 Name of Substance 1: cocaine/ crack  1 - Age of First Use: UTA 1 - Amount (size/oz): .5 gram a day 1 - Frequency: daily  1 - Duration: "the last five months" 1 - Last Use / Amount: last night Substance #2 Name of Substance 2: alcohol 2 - Age of First Use: UTA 2 - Amount (size/oz): 4-5 beers  2 - Frequency: daily 2 - Duration: ongoing 2 - Last Use / Amount: yesterday evening   CIWA: CIWA-Ar BP: (!) 120/92 Pulse Rate: 80 COWS:    PATIENT STRENGTHS: (choose at least two) Average or above average intelligence General fund of knowledge  Allergies: No Known Allergies  Home Medications:  (Not in a hospital admission)  OB/GYN Status:  No LMP for male patient.  General Assessment Data [facility] Assessment: In system Is this a Tele or Face-to-Face Assessment?: Tele Assessment Is this an Initial Assessment or a Re-assessment for this encounter?: Initial Assessment Marital status: Single Maiden name: n/a Is patient pregnant?: No Pregnancy Status: No Living Arrangements: Other (Comment) (homeless) Can pt return to current living arrangement?: Yes Admission Status: Voluntary Is patient capable of signing voluntary admission?: Yes Referral Source: Self/Family/Friend Insurance type: SP  Medical Screening Exam [facility] Walk-in ONLY) Medical Exam completed: Yes  Crisis Care Plan Living Arrangements: Other (Comment) (homeless) Legal Guardian: Other: (self) Name of Psychiatrist: n/a (as a child, ADHD) Name of Therapist: n/a  Education  Status Is patient currently in school?: No Current Grade: n/a Highest grade of school patient has completed: 12th Name of school: n/a  Risk to self with the past 6 months Suicidal Ideation: No Has patient been a risk to self within the  past 6 months prior to admission? : Yes Suicidal Intent: No Has patient had any suicidal intent within the past 6 months prior to admission? : No Is patient at risk for suicide?: No Suicidal Plan?: No Has patient had any suicidal plan within the past 6 months prior to admission? : No Access to Means: No What has been your use of drugs/alcohol within the last 12 months?: cocaine and alcohol Previous Attempts/Gestures: No How many times?: 0 Other Self Harm Risks: n/a Triggers for Past Attempts: None known Intentional Self Injurious Behavior: None Family Suicide History: No Recent stressful life event(s): Other (Comment) Persecutory voices/beliefs?: Yes Depression: Yes Depression Symptoms: Feeling angry/irritable Substance abuse history and/or treatment for substance abuse?: Yes Suicide prevention information given to non-admitted patients: Not applicable  Risk to Others within the past 6 months Homicidal Ideation: Yes-Currently Present Does patient have any lifetime risk of violence toward others beyond the six months prior to admission? : Yes (comment) Thoughts of Harm to Others: Yes-Currently Present Comment - Thoughts of Harm to Others: to torture someone Current Homicidal Intent: Yes-Currently Present Current Homicidal Plan: No Access to Homicidal Means: Yes Describe Access to Homicidal Means: "I dont need weapons to do it" Identified Victim: multiple people History of harm to others?: Yes Assessment of Violence: In past 6-12 months ("I've done some stuff but i didnt kill no body") Violent Behavior Description: "I've done some things but i didnt kill anybody" Does patient have access to weapons?: No Criminal Charges Pending?: Yes Describe Pending Criminal Charges: stolen goods Does patient have a court date: Yes Court Date: 09/16/17 Is patient on probation?: No  Psychosis Hallucinations: Visual Delusions: Grandiose  Mental Status Report Appearance/Hygiene:  Disheveled Eye Contact: Fair Motor Activity: Freedom of movement Speech: Logical/coherent Level of Consciousness: Alert Mood: Depressed Affect: Depressed Anxiety Level: Minimal Thought Processes: Coherent Judgement: Impaired Orientation: Person, Place, Time, Situation Obsessive Compulsive Thoughts/Behaviors: None  Cognitive Functioning Concentration: Decreased Memory: Recent Intact, Remote Intact IQ: Average Insight: Poor Impulse Control: Poor Appetite: Fair Weight Loss: 0 Weight Gain: 0 Sleep: Decreased Vegetative Symptoms: None  ADLScreening [facility] Assessment Services) Patient's cognitive ability adequate to safely complete daily activities?: Yes Patient able to express need for assistance with ADLs?: Yes Independently performs ADLs?: Yes (appropriate for developmental age)  Prior Inpatient Therapy Prior Inpatient Therapy: No  Prior Outpatient Therapy Prior Outpatient Therapy: No Does patient have an ACCT team?: No Does patient have Intensive In-House Services?  : No Does patient have [facility] services? : No Does patient have P4CC services?: No  ADL Screening (condition at time of admission) Patient's cognitive ability adequate to safely complete daily activities?: Yes Is the patient deaf or have difficulty hearing?: No Does the patient have difficulty seeing, even when wearing glasses/contacts?: No Does the patient have difficulty concentrating, remembering, or making decisions?: No Patient able to express need for assistance with ADLs?: Yes Does the patient have difficulty dressing or bathing?: No Independently performs ADLs?: Yes (appropriate for developmental age)       Abuse/Neglect Assessment (Assessment to be complete while patient is alone) Physical Abuse: Denies Verbal Abuse: Denies Sexual Abuse: Denies     Advance Directives (For Healthcare) Does Patient Have a Medical Advance Directive?: No Would patient like information  on creating a medical  advance directive?: Yes (Inpatient - patient defers creating a medical advance directive at this time)    Additional Information 1:1 In Past 12 Months?: No CIRT Risk: No Elopement Risk: No Does patient have medical clearance?: Yes     Disposition:  Disposition Initial Assessment Completed for this Encounter: Yes Disposition of Patient: Inpatient treatment program Type of inpatient treatment program: Adult  This service was provided via telemedicine using a 2-way, interactive audio and video technology.     Clayton Vaughan 09/09/2017 3:27 PM

## 2017-09-09 NOTE — ED Notes (Signed)
Patient belongings put in locker number 6 in pod F

## 2017-09-09 NOTE — ED Provider Notes (Signed)
[facility] EMERGENCY DEPARTMENT Provider Note   CSN: [id number] Arrival date & time: 09/09/17  4098     History   Chief Complaint No chief complaint on file.   HPI Clayton Vaughan is a 26 y.o. male.  HPI   Patient is a 26 year old male with a history of sickle cell trait and polysubstance use presenting for blistering of bilateral feet.  Patient reports he has been on his feet with socks and shoes most of the day.  Patient denies walking around barefoot or contacting his feet with hot surfaces.  Patient reports he has tried to relieve this pain by lifting his feet off the ground.  Patient denies any joint pain, fever, chill. Patient denies any injury to the feet. Patient reports no new medications or other rashes concerning for allergic reaction. Patient is currently homeless. Patient walks up to 12 miles per day. Patient reports he changes his socks every 3-4 days. Patient is not diabetic.  Patient reports with he takes multiple drugs including cocaine, marijuana, and opioids.  On chart review, patient was seen within the last couple days for hallucinations and TTS was consulted.  Determined that patient can follow-up outpatient.  Patient has not seen any provider since this time.  Patient is not on any antipsychotic medication. Today, patient is stating that he is "on a battle for the Lord." Patient denies SI. Patient denies any specific plan for harming others or homicidal ideation, however he reports he "wants to torture some people".  Past Medical History:  Diagnosis Date  . Sickle cell trait (HCC)   . Sickle cell trait [facility])     Patient Active Problem List   Diagnosis Date Noted  . Auditory hallucinations 09/05/2017  . Polysubstance abuse (HCC) 09/04/2017  . Substance induced mood disorder (HCC) 09/04/2017  . Cocaine abuse (HCC) 06/26/2017    History reviewed. No pertinent surgical history.     Home Medications    Prior to Admission  medications   Medication Sig Start Date End Date Taking? Authorizing Provider  amoxicillin (AMOXIL) 500 MG capsule Take 1 capsule (500 mg total) by mouth 3 (three) times daily. Patient not taking: Reported on 06/25/2017 05/23/17   Bethann Berkshire, MD  cephALEXin (KEFLEX) 500 MG capsule Take 1 capsule (500 mg total) by mouth 4 (four) times daily. 09/09/17   Aviva Kluver B, PA-C  HYDROcodone-acetaminophen (NORCO/VICODIN) 5-325 MG tablet Take 1 tablet by mouth every 4 (four) hours as needed for severe pain. Patient not taking: Reported on 06/25/2017 06/11/17   Mardella Layman, MD  ibuprofen (ADVIL,MOTRIN) 600 MG tablet Take 1 tablet (600 mg total) by mouth every 6 (six) hours as needed. Patient not taking: Reported on 06/25/2017 06/09/17   Dietrich Pates, PA-C  terbinafine (LAMISIL) 250 MG tablet Take 1 tablet (250 mg total) by mouth daily. 09/09/17   Aviva Kluver B, PA-C  traMADol (ULTRAM) 50 MG tablet Take 1 tablet (50 mg total) by mouth every 12 (twelve) hours as needed. Patient not taking: Reported on 06/25/2017 04/06/17   Naida Sleight, PA-C    Family History No family history on file.  Social History Social History  Substance Use Topics  . Smoking status: Current Every Day Smoker    Packs/day: 1.00    Types: Cigarettes  . Smokeless tobacco: Never Used  . Alcohol use Yes     Allergies   Patient has no known allergies.   Review of Systems Review of Systems  Constitutional: Negative for chills  and fever.  Skin: Positive for wound.  Neurological: Negative for weakness and numbness.  Psychiatric/Behavioral: Positive for hallucinations. Negative for suicidal ideas.     Physical Exam Updated Vital Signs BP (!) 120/92 (BP Location: Right Arm)   Pulse 80   Temp (!) 97.3 F (36.3 C) (Oral)   Resp 16   SpO2 99%   Physical Exam  Constitutional: He appears well-developed and well-nourished. No distress.  Sitting comfortably in bed.  HENT:  Head: Normocephalic and atraumatic.  Eyes:  Conjunctivae are normal. Right eye exhibits no discharge. Left eye exhibits no discharge.  EOMs normal to gross examination.  Neck: Normal range of motion.  Cardiovascular: Normal rate and regular rhythm.   Intact, 2+ radial pulse. Intact, 2+ DP and PT pulses bilaterally.  Pulmonary/Chest:  Normal respiratory effort. Patient converses comfortably. No audible wheeze or stridor.  Abdominal: He exhibits no distension.  Musculoskeletal: Normal range of motion.  No calf tenderness.   Neurological: He is alert.  Cranial nerves intact to gross observation. Patient moves extremities with good coordination and symmetrically.  Skin: Skin is warm. He is not diaphoretic. There is erythema.  Multiple blisters and scales on the plantar surface of bilateral feet.  Toenails demonstrate onychomycosis.  No open wounds. See image below.  Psychiatric:  Patient with difficulty concentrating.  Patient's thoughts are not linear.  Patient has difficulty answering questions directly or caring on the conversation.  Patient is reporting hallucinations of "spirits".  Patient reports he is "on a battle for the Lord." No SI.  Patient reports he occasionally feels that he would like to torture people although is not forthcoming about who these individuals are and why he feels this way.  Nursing note and vitals reviewed.       ED Treatments / Results  Labs (all labs ordered are listed, but only abnormal results are displayed) Labs Reviewed  COMPREHENSIVE METABOLIC PANEL - Abnormal; Notable for the following:       Result Value   Glucose, Bld 106 (*)    All other components within normal limits  RAPID URINE DRUG SCREEN, HOSP PERFORMED - Abnormal; Notable for the following:    Cocaine POSITIVE (*)    All other components within normal limits  CBC WITH DIFFERENTIAL/PLATELET - Abnormal; Notable for the following:    RDW 15.6 (*)    All other components within normal limits  ETHANOL    EKG  EKG  Interpretation None       Radiology No results found.  Procedures Procedures (including critical care time)  Medications Ordered in ED Medications  acetaminophen (TYLENOL) tablet 650 mg (not administered)  ondansetron (ZOFRAN) tablet 4 mg (not administered)  nicotine (NICODERM CQ - dosed in mg/24 hours) patch 21 mg (21 mg Transdermal Patch Applied 09/09/17 1830)  cephALEXin (KEFLEX) capsule 500 mg (not administered)  terbinafine (LAMISIL) tablet 250 mg (not administered)  Tdap (BOOSTRIX) injection 0.5 mL (0.5 mLs Intramuscular Given 09/09/17 1452)  cephALEXin (KEFLEX) capsule 500 mg (500 mg Oral Given 09/09/17 1452)     Initial Impression / Assessment and Plan / ED Course  I have reviewed the triage vital signs and the nursing notes.  Pertinent labs & imaging results that were available during my care of the patient were reviewed by me and considered in my medical decision making (see chart for details).     Final Clinical Impressions(s) / ED Diagnoses   Final diagnoses:  Blister of foot, unspecified laterality, initial encounter  Delusions (HCC)  Onychomycosis  Examination of the foot exhibits erythema, scaling, and blisters possibly concerning for walking long distances without proper foot protection or first-degree burns consistent with sun or hot surface exposure.  No evidence of pus formation in blisters. Due to multiple exposures and evidence of fungal infection on the feet, will proceed with  bacterial coverage for skin and soft tissue infection as well as antifungal coverage for onychomycosis.  Liver function normal. Patient is not diabetic and does not have any other immuno suppressing risk factors that would warrant concern for poor healing infections.  Patient is currently notably altered, possibly in a drug induced state, and is expressing psychotic ideas. Patient reports that he is Forensic psychologistfighting demons for [employer] Lord, and can see spirits.  Patient reports he has  thoughts of harming others through torture but has no specific plan.  Patient denies SI. Will initiate TTS consult and medical clearance.  Discussed this with patient, and patient is in understanding and agreement with plan of care.   EKG obtained to get baseline QTC.  EKG demonstrates elevated T waves and possible early repolarization. Potassium normal and patient not complaining of cardiopulmonary complaints.   Tetanus updated today.  Patient is medically cleared of his foot blistering. Patient should receive prescriptions for onychomycosis and prophylaxis for skin infection of foot with Keflex.    New Prescriptions New Prescriptions   CEPHALEXIN (KEFLEX) 500 MG CAPSULE    Take 1 capsule (500 mg total) by mouth 4 (four) times daily.   TERBINAFINE (LAMISIL) 250 MG TABLET    Take 1 tablet (250 mg total) by mouth daily.     Elisha PonderMurray, Coye Dawood B, PA-C 09/09/17 1841    Pricilla LovelessGoldston, Scott, MD 09/10/17 [phone]

## 2017-09-09 NOTE — ED Notes (Signed)
Patient was given a cup of Coffee and given Cheree DittoGraham Crackers and Peanut Butter. Regular Diet Ordered for Sara LeeDinner.

## 2017-09-09 NOTE — ED Notes (Signed)
Patient given hygiene items to clean feet

## 2017-09-09 NOTE — Discharge Instructions (Addendum)
Patient should receive Keflex for prophylaxis due to ulcerations in his feet to finish 5 day course. Full course prescribed in the ED in case patient has extended stay waiting for placement. Should patient not receive full course while awaiting placement in the ED, he should be prescribed the remainder of the course upon discharge.  Patient has onychomycosis of bilateral feet. I prescribed 14 days of Lamisil and patient should follow up with PCP afterwards to finish remainder of therapy with Lamisil. I prescribed 7 days in ED in case patient has extended stay waiting for placement. Should patient be discharged before 14 days have lapsed. Liver function normal today.

## 2017-09-10 ENCOUNTER — Encounter (HOSPITAL_COMMUNITY): Payer: Self-pay | Admitting: Registered Nurse

## 2017-09-10 DIAGNOSIS — F17210 Nicotine dependence, cigarettes, uncomplicated: Secondary | ICD-10-CM

## 2017-09-10 DIAGNOSIS — R441 Visual hallucinations: Secondary | ICD-10-CM

## 2017-09-10 DIAGNOSIS — F1410 Cocaine abuse, uncomplicated: Secondary | ICD-10-CM

## 2017-09-10 DIAGNOSIS — R440 Auditory hallucinations: Secondary | ICD-10-CM

## 2017-09-10 DIAGNOSIS — F1910 Other psychoactive substance abuse, uncomplicated: Secondary | ICD-10-CM

## 2017-09-10 DIAGNOSIS — F1210 Cannabis abuse, uncomplicated: Secondary | ICD-10-CM

## 2017-09-10 MED ORDER — OLANZAPINE 5 MG PO TABS
5.0000 mg | ORAL_TABLET | Freq: Every day | ORAL | Status: DC
  Administered 2017-09-10: 5 mg via ORAL
  Filled 2017-09-10: qty 1

## 2017-09-10 MED ORDER — BENZTROPINE MESYLATE 1 MG PO TABS
1.0000 mg | ORAL_TABLET | Freq: Every day | ORAL | Status: DC
  Administered 2017-09-10: 1 mg via ORAL
  Filled 2017-09-10: qty 1

## 2017-09-10 NOTE — ED Notes (Signed)
Pt bedside table cleared off. Pt reports he does not need anything at this time.

## 2017-09-10 NOTE — ED Notes (Signed)
Patient offered tylenol for pain. Patient refusing at this time

## 2017-09-10 NOTE — ED Provider Notes (Signed)
Patient has been medically clear for past 24+ hours.   BH team has reassessed today, and is working on inpatient psychiatric placement.   Vitals:   09/09/17 2042 09/10/17 0650  BP: 128/71 109/71  Pulse: 76 62  Resp: 18 18  Temp: 98 F (36.7 C) 98.2 F (36.8 C)  SpO2: 98% 100%   Pt appears alert, content, and in no acute distress.  Does not currently appear to be responding to internal stimuli.      Cathren LaineSteinl, Daymein Nunnery, MD 09/10/17 1153

## 2017-09-10 NOTE — ED Notes (Signed)
TTS at bedside. 

## 2017-09-10 NOTE — ED Notes (Signed)
Pt reports he does not want any pain medication at this time.

## 2017-09-10 NOTE — Consult Note (Signed)
Telepsych Consultation   Reason for Consult:  Auditory Hallucinations Referring Physic ian:  EPD Location of Patient:  [place] Location of Provider: [facility]  Patient Identification: Clayton Vaughan MRN:  [id number] Principal Diagnosis: <principal problem not specified> Diagnosis:   Patient Active Problem List   Diagnosis Date Noted  . Auditory hallucinations [R44.0] 09/05/2017  . Polysubstance abuse ([place]) [F19.10] 09/04/2017  . Substance induced mood disorder ([place]) [F19.94] 09/04/2017  . Cocaine abuse ([place]) [F14.10] 06/26/2017    Total Time spent with patient: 45 minutes  Subjective:   Clayton Vaughan is awake, alert. Seen via tele-assessment. Patient continues to endorse auditory hallucinations to hurt others. Reports he is able to speak to the "sprit world."   Denies suicidal or homicidal ideation. Discussed starting medication (zyprexa) to aid with the voices. Patient was agreeable to treatment plan. TTS to  continue to seek inpatient placement. Support, encouragement and reassurance was provided.    Clayton Vaughan is a 26 y.o. male patient admitted on 09/09/2017 Per tele-assessment-- Clayton Vaughan is an 26 y.o. male presented to the ED after recent HI and complaints of hallucinations. Patient remained in the ED and was evaluated daily. Patient was discharged on 09/05/17, stable for d/c and provided resources. The patient presents today with a medical complaint but also reports HI with a plan to torture someone by cutting off their feet, states last thought was last night. Had thoughts toward someone he knows and someone he didn't know.  Denies having any weapons but reports he doesn't need weapons to carry out the act. Reports, "I'm gods son. I took an oath." Also reports visual hallucinations, "I see spirits and demons." The patient denies any mental health treatment in the past. Has not followed up with treatment since his discharge on  09/05/17.  Denies SI currently but admits to recent thoughts. The patient regularly abuses alcohol and cocaine. Last use was yesterday.  HPI:   Past Psychiatric History:   Risk to Self: Suicidal Ideation: No Suicidal Intent: No Is patient at risk for suicide?: No Suicidal Plan?: No Access to Means: No What has been your use of drugs/alcohol within the last 12 months?: cocaine and alcohol How many times?: 0 Other Self Harm Risks: n/a Triggers for Past Attempts: None known Intentional Self Injurious Behavior: None Risk to Others: Homicidal Ideation: Yes-Currently Present Thoughts of Harm to Others: Yes-Currently Present Comment - Thoughts of Harm to Others: to torture someone Current Homicidal Intent: Yes-Currently Present Current Homicidal Plan: No Access to Homicidal Means: Yes Describe Access to Homicidal Means: "I dont need weapons to do it" Identified Victim: multiple people History of harm to others?: Yes Assessment of Violence: In past 6-12 months ("I've done some stuff but i didnt kill no body") Violent Behavior Description: "I've done some things but i didnt kill anybody" Does patient have access to weapons?: No Criminal Charges Pending?: Yes Describe Pending Criminal Charges: stolen goods Does patient have a court date: Yes Court Date: 09/16/17 Prior Inpatient Therapy: Prior Inpatient Therapy: No Prior Outpatient Therapy: Prior Outpatient Therapy: No Does patient have an ACCT team?: No Does patient have Intensive In-House Services?  : No Does patient have [facility] services? : No Does patient have P4CC services?: No  Past Medical History:  Past Medical History:  Diagnosis Date  . Sickle cell trait ([place])   . Sickle cell trait ([place])    History reviewed. No pertinent surgical history. Family History: History reviewed. No  pertinent family history. Family Psychiatric  History:  Social History:  History  Alcohol Use  . Yes     History  Drug Use  . Types:  Marijuana, Cocaine    Social History   Social History  . Marital status: Single    Spouse name: N/A  . Number of children: N/A  . Years of education: N/A   Social History Main Topics  . Smoking status: Current Every Day Smoker    Packs/day: 1.00    Types: Cigarettes  . Smokeless tobacco: Never Used  . Alcohol use Yes  . Drug use: Yes    Types: Marijuana, Cocaine  . Sexual activity: Not Asked   Other Topics Concern  . None   Social History Narrative  . None   Additional Social History:    Allergies:  No Known Allergies  Labs:  Results for orders placed or performed during the hospital encounter of 09/09/17 (from the past 48 hour(s))  Comprehensive metabolic panel     Status: Abnormal   Collection Time: 09/09/17 12:45 PM  Result Value Ref Range   Sodium 140 135 - 145 mmol/L   Potassium 3.8 3.5 - 5.1 mmol/L   Chloride 106 101 - 111 mmol/L   CO2 28 22 - 32 mmol/L   Glucose, Bld 106 (H) 65 - 99 mg/dL   BUN 9 6 - 20 mg/dL   Creatinine, Ser 1.21 0.61 - 1.24 mg/dL   Calcium 9.5 8.9 - 10.3 mg/dL   Total Protein 7.4 6.5 - 8.1 g/dL   Albumin 4.3 3.5 - 5.0 g/dL   AST 31 15 - 41 U/L   ALT 23 17 - 63 U/L   Alkaline Phosphatase 71 38 - 126 U/L   Total Bilirubin 0.7 0.3 - 1.2 mg/dL   GFR calc non Af Amer >60 >60 mL/min   GFR calc Af Amer >60 >60 mL/min    Comment: (NOTE) The eGFR has been calculated using the CKD EPI equation. This calculation has not been validated in all clinical situations. eGFR's persistently <60 mL/min signify possible Chronic Kidney Disease.    Anion gap 6 5 - 15  CBC with Diff     Status: Abnormal   Collection Time: 09/09/17 12:45 PM  Result Value Ref Range   WBC 5.2 4.0 - 10.5 K/uL   RBC 5.19 4.22 - 5.81 MIL/uL   Hemoglobin 14.9 13.0 - 17.0 g/dL   HCT 43.3 39.0 - 52.0 %   MCV 83.4 78.0 - 100.0 fL   MCH 28.7 26.0 - 34.0 pg   MCHC 34.4 30.0 - 36.0 g/dL   RDW 15.6 (H) 11.5 - 15.5 %   Platelets 246 150 - 400 K/uL   Neutrophils Relative % 45  %   Neutro Abs 2.3 1.7 - 7.7 K/uL   Lymphocytes Relative 44 %   Lymphs Abs 2.3 0.7 - 4.0 K/uL   Monocytes Relative 8 %   Monocytes Absolute 0.4 0.1 - 1.0 K/uL   Eosinophils Relative 3 %   Eosinophils Absolute 0.1 0.0 - 0.7 K/uL   Basophils Relative 0 %   Basophils Absolute 0.0 0.0 - 0.1 K/uL  Ethanol     Status: None   Collection Time: 09/09/17 12:46 PM  Result Value Ref Range   Alcohol, Ethyl (B) <10 <10 mg/dL    Comment:        LOWEST DETECTABLE LIMIT FOR SERUM ALCOHOL IS 10 mg/dL FOR MEDICAL PURPOSES ONLY   Urine rapid drug screen (hosp performed)  Status: Abnormal   Collection Time: 09/09/17 12:53 PM  Result Value Ref Range   Opiates NONE DETECTED NONE DETECTED   Cocaine POSITIVE (A) NONE DETECTED   Benzodiazepines NONE DETECTED NONE DETECTED   Amphetamines NONE DETECTED NONE DETECTED   Tetrahydrocannabinol NONE DETECTED NONE DETECTED   Barbiturates NONE DETECTED NONE DETECTED    Comment:        DRUG SCREEN FOR MEDICAL PURPOSES ONLY.  IF CONFIRMATION IS NEEDED FOR ANY PURPOSE, NOTIFY LAB WITHIN 5 DAYS.        LOWEST DETECTABLE LIMITS FOR URINE DRUG SCREEN Drug Class       Cutoff (ng/mL) Amphetamine      1000 Barbiturate      200 Benzodiazepine   381 Tricyclics       017 Opiates          300 Cocaine          300 THC              50     Medications:  Current Facility-Administered Medications  Medication Dose Route Frequency Provider Last Rate Last Dose  . acetaminophen (TYLENOL) tablet 650 mg  650 mg Oral Q4H PRN Murray, Alyssa B, PA-C   650 mg at 09/10/17 1026  . cephALEXin (KEFLEX) capsule 500 mg  500 mg Oral Q6H Murray, Alyssa B, PA-C   500 mg at 09/10/17 0826  . nicotine (NICODERM CQ - dosed in mg/24 hours) patch 21 mg  21 mg Transdermal Daily Murray, Alyssa B, PA-C   21 mg at 09/09/17 1830  . ondansetron (ZOFRAN) tablet 4 mg  4 mg Oral Q8H PRN Valere Dross, Alyssa B, PA-C      . terbinafine (LAMISIL) tablet 250 mg  250 mg Oral Daily Murray, Alyssa B, PA-C    250 mg at 09/10/17 1028   Current Outpatient Prescriptions  Medication Sig Dispense Refill  . amoxicillin (AMOXIL) 500 MG capsule Take 1 capsule (500 mg total) by mouth 3 (three) times daily. (Patient not taking: Reported on 06/25/2017) 21 capsule 0  . cephALEXin (KEFLEX) 500 MG capsule Take 1 capsule (500 mg total) by mouth 4 (four) times daily. 20 capsule 0  . HYDROcodone-acetaminophen (NORCO/VICODIN) 5-325 MG tablet Take 1 tablet by mouth every 4 (four) hours as needed for severe pain. (Patient not taking: Reported on 06/25/2017) 12 tablet 0  . ibuprofen (ADVIL,MOTRIN) 600 MG tablet Take 1 tablet (600 mg total) by mouth every 6 (six) hours as needed. (Patient not taking: Reported on 06/25/2017) 30 tablet 0  . terbinafine (LAMISIL) 250 MG tablet Take 1 tablet (250 mg total) by mouth daily. 14 tablet 0  . traMADol (ULTRAM) 50 MG tablet Take 1 tablet (50 mg total) by mouth every 12 (twelve) hours as needed. (Patient not taking: Reported on 06/25/2017) 20 tablet 0    Musculoskeletal: Strength & Muscle Tone: UTA Gait & Station: UTA Patient leans: UTA  Psychiatric Specialty Exam: Physical Exam  Vitals reviewed. Constitutional: He appears well-developed.  Cardiovascular: Normal rate.   Neurological: He is alert.  Psychiatric: He has a normal mood and affect.    Review of Systems  Psychiatric/Behavioral: Positive for hallucinations and substance abuse. Negative for depression and suicidal ideas.    Blood pressure 109/71, pulse 62, temperature 98.2 F (36.8 C), temperature source Oral, resp. rate 18, SpO2 100 %.There is no height or weight on file to calculate BMI.  General Appearance: Casual  Eye Contact:  Fair  Speech:  Pressured  Volume:  Normal  Mood:  Depressed  Affect:  Inappropriate and Labile  Thought Process:  Disorganized  Orientation:  Full (Time, Place, and Person)  Thought Content:  Hallucinations: Auditory Command:  hurt others  Suicidal Thoughts:  No  Homicidal Thoughts:  No   Memory:  Immediate;   Fair Recent;   Fair  Judgement:  Fair  Insight:  Fair  Psychomotor Activity:  UTA  Concentration:  Concentration: Fair  Recall:  Poor  Fund of Knowledge:  Poor  Language:  Fair  Akathisia:  UTA  Handed:   AIMS (if indicated):     Assets:  Communication Skills Desire for Improvement Resilience Social Support  ADL's:  Intact  Cognition:  WNL  Sleep:        I agree with current treatment plan on 09/10/2017, Patient seen face-to-face for psychiatric evaluation follow-up, chart reviewed and case discussed with the MD [place] Provider S. [place]  and Treatment team. Reviewed the information documented and agree with the treatment plan. Spoke to EDP for medication   Treatment Plan Summary: Daily contact with patient to assess and evaluate symptoms and progress in treatment and Medication management - Consider starting  Zyprexa 5 mg PO QHS for mood stabilization  - Consider starting Cogentin 1 mg PO for EPS - EKG   Disposition: Recommend psychiatric Inpatient admission when medically cleared.  -TTS continue to seek inpatient    This service was provided via telemedicine using a 2-way, interactive audio and video technology.  Names of all persons participating in this telemedicine service and their role in this encounter. Name: Millie Role: RN  Name: Dahlia Bailiff  Role: NP  Name: [phone]- no answer  Role: MD ( pod E)  Name: MD Frederico Hamman reports she will notify MD [place]ine _0 :34 Role:     Derrill [place], NP 09/10/2017 10:41 AM

## 2017-09-11 NOTE — ED Notes (Signed)
[facility] recommended possible IVC for patient, stated she would speak to NP and return call.  Spoke to Dr. Jeraldine LootsLockwood and Dr. Denton LankSteinl about patient, and per recent assessment notes, patient does not meet IVC criteria.  Dr. Jeraldine LootsLockwood to d/c.  Pt given prescriptions from the other day which were on chart and belongings returned (valuables and clothing).

## 2017-09-11 NOTE — ED Notes (Signed)
Verified pt belongings located in locker 6. Inventory sheet located in front of locker.

## 2017-09-11 NOTE — [facility] Counselor (Signed)
Pt denies SI/HI.  Pt reports AVH. Pt states he hears evil voices and sees evil people.   TTS will continue to seek placement.  Wolfgang PhoenixBrandi Sebron Mcma[place], [facility] Triage Specialist

## 2017-09-11 NOTE — ED Notes (Signed)
This RN went in to room to obtain dinner order for patient.  Patient asking if he is able to go home.  States he feels confined here, and doesn't want to "just sit around in the room anymore".  This RN spoke to Dr. Denton LankSteinl who recommended TTS reassess.  Order placed and Clayton Vaughan called.

## 2017-09-11 NOTE — ED Provider Notes (Signed)
Patient does have some agitation, but is awake, alert, ambulatory, denies suicidal thoughts. He states that he would like to leave. Patient does not meet IVC criteria as he has no suicidal thoughts, not obviously danger to himself nor others, and has not been combative.  Patient encouraged to follow-up with primary care, provided prescriptions for his blisters.   Clayton Vaughan, Min Tunnell, MD 09/11/17 [phone]

## 2017-09-11 NOTE — ED Notes (Signed)
Patient able to ambulate independently  

## 2017-10-14 IMAGING — DX DG FOOT COMPLETE 3+V*L*
3 series · 3 of 3 positions shown · non-contrast
Comparison: None.

CLINICAL DATA: Left foot pain, no known injury, initial encounter

EXAM:
LEFT FOOT - COMPLETE 3+ VIEW

[foot ap]
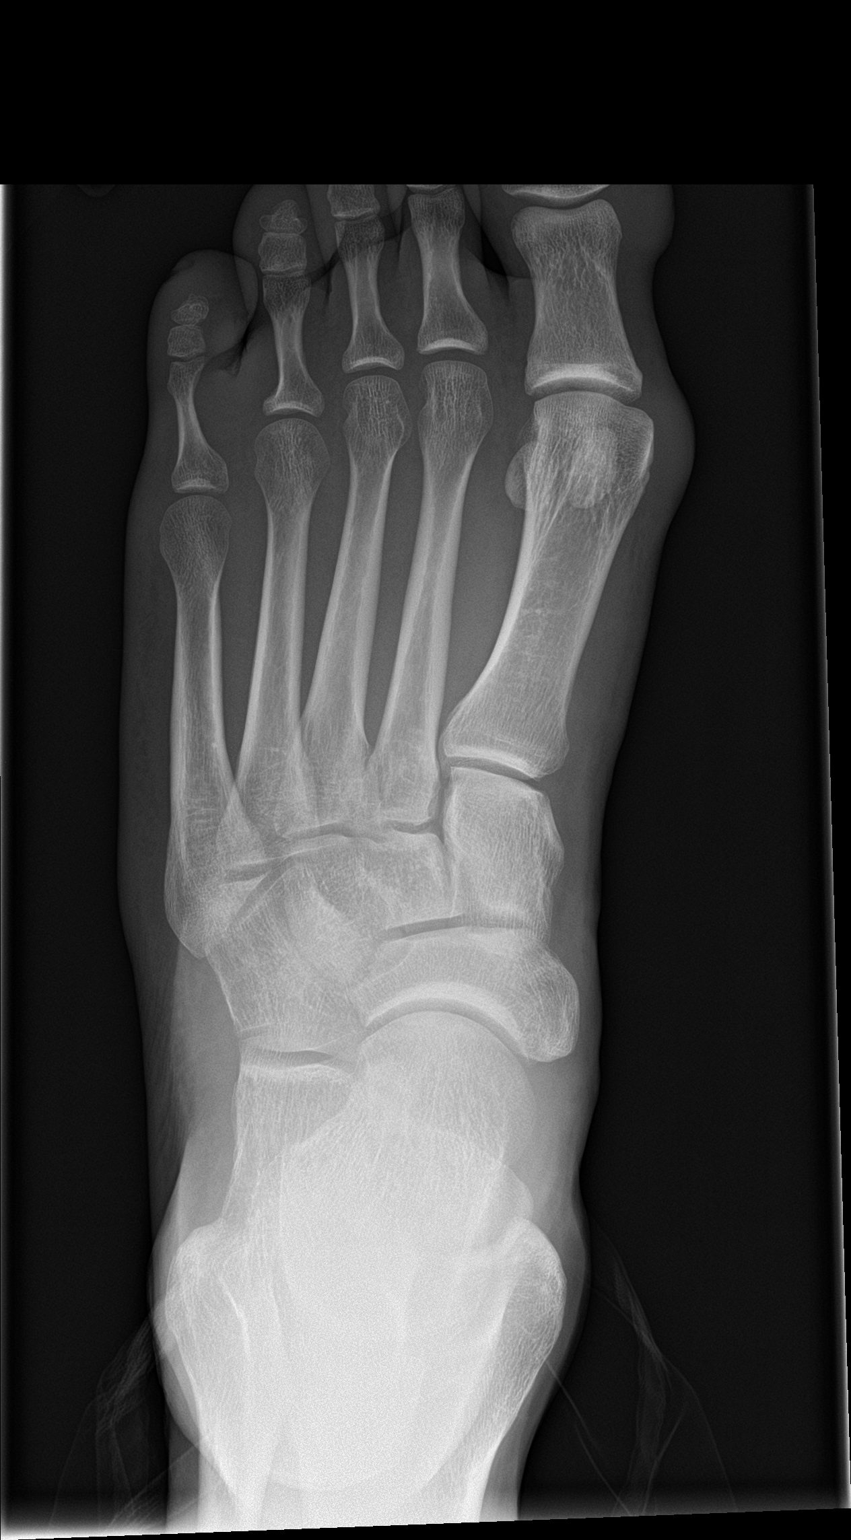

[foot obl]
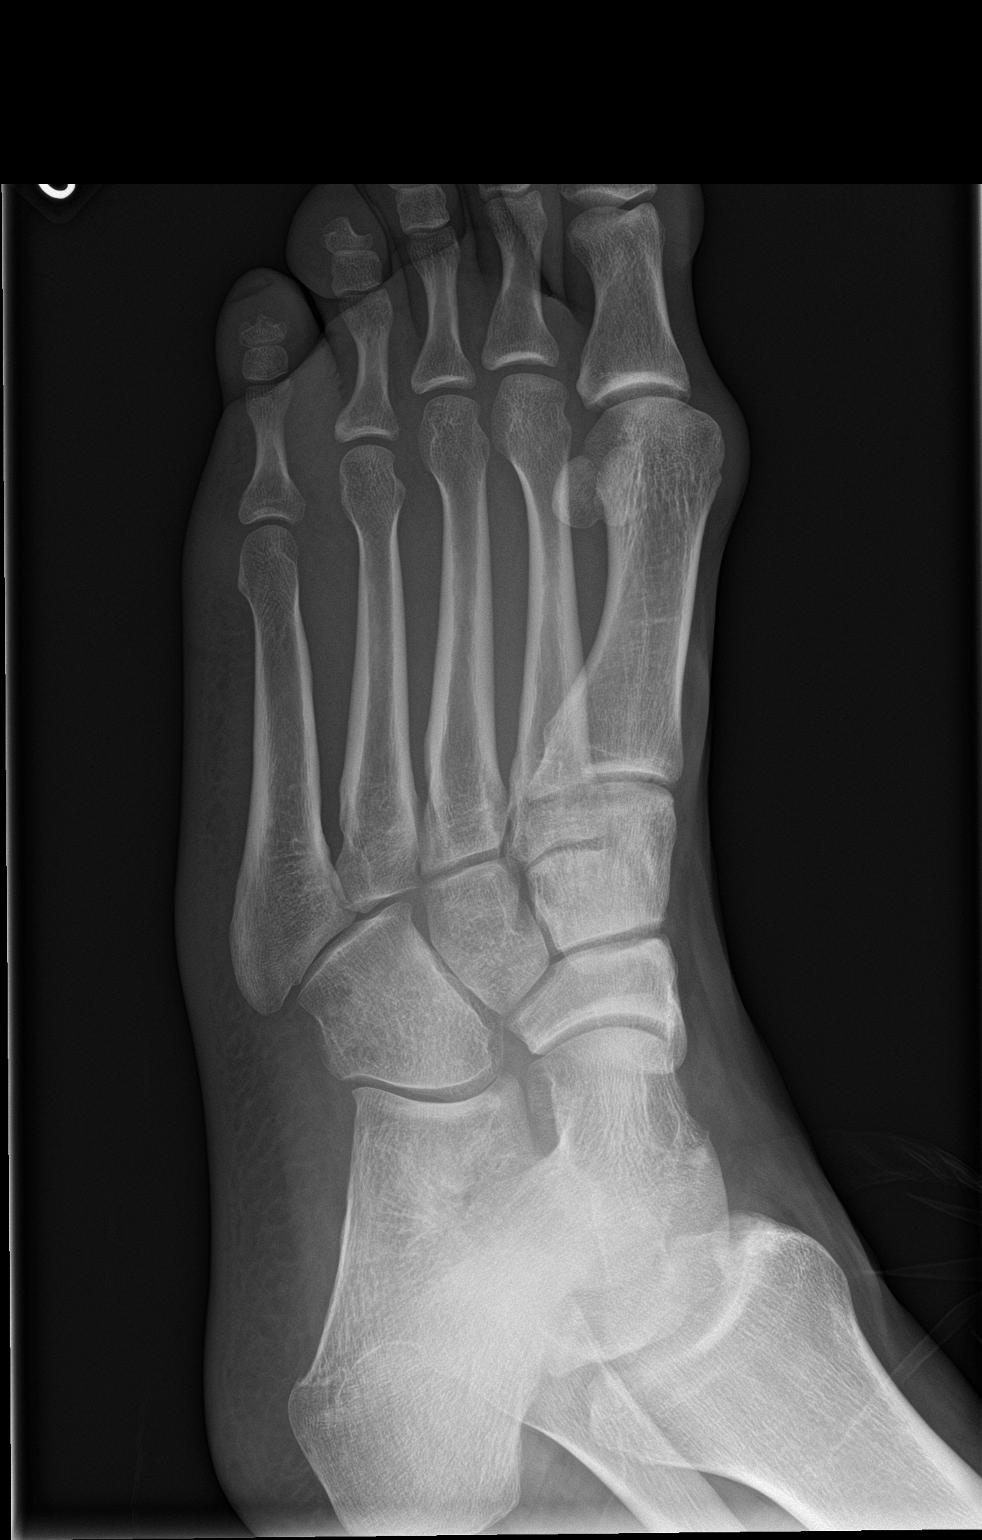

[foot lat]
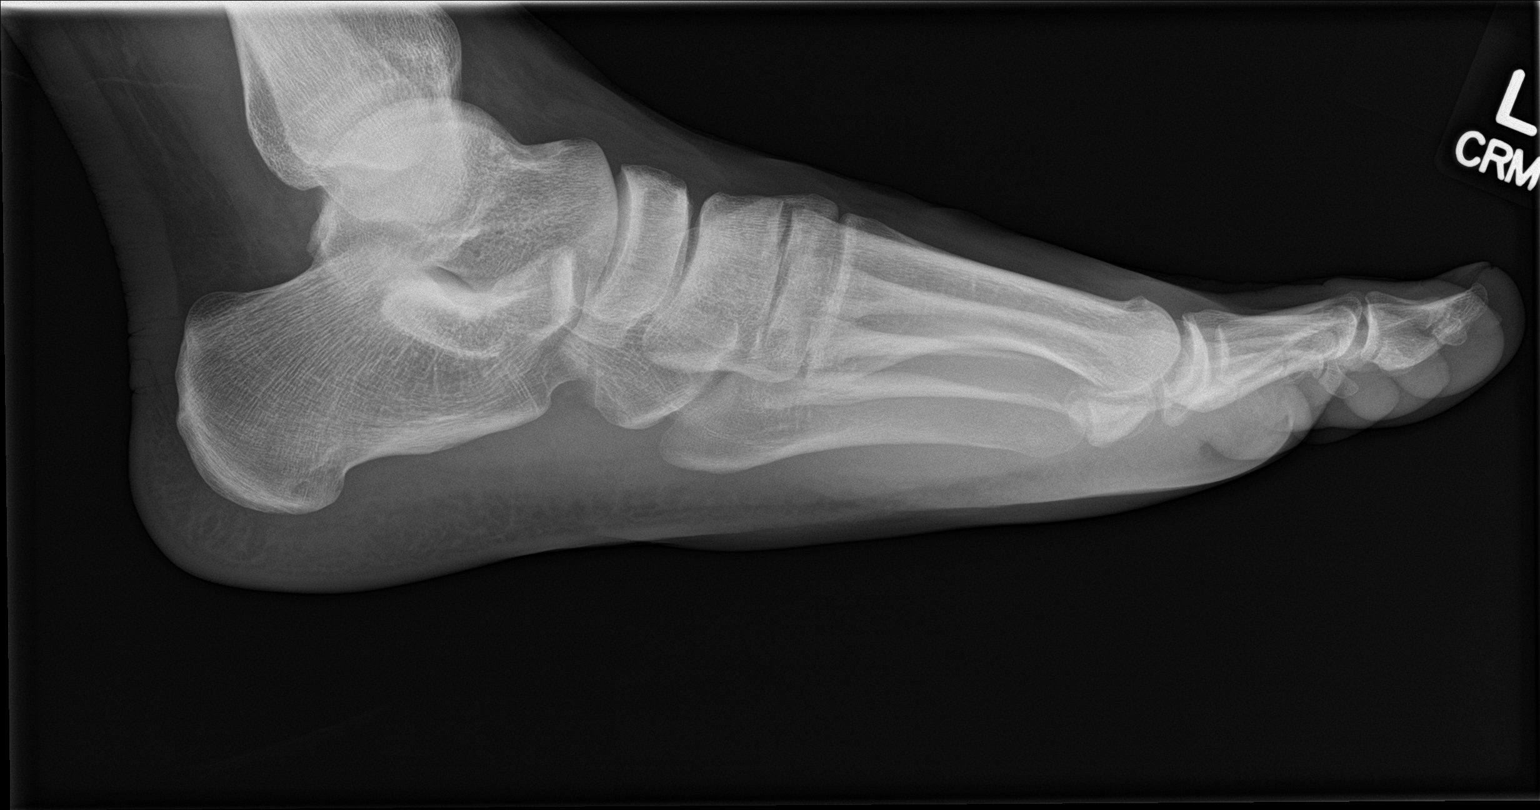

[3 of 3 positions shown; findings below may reference images not displayed]

FINDINGS: No acute fracture or dislocation is noted. Mild soft tissue changes
are seen consistent with the given clinical history. No other focal
abnormality is noted.
IMPRESSION: Soft tissue swelling without acute bony abnormality.

## 2017-11-06 ENCOUNTER — Encounter (HOSPITAL_COMMUNITY): Payer: Self-pay | Admitting: Family Medicine

## 2017-11-06 ENCOUNTER — Ambulatory Visit (HOSPITAL_COMMUNITY)
Admission: EM | Admit: 2017-11-06 | Discharge: 2017-11-06 | Disposition: A | Discharge status: Home / Self Care | Payer: Self-pay | Attending: Family Medicine | Admitting: Family Medicine

## 2017-11-06 ENCOUNTER — Other Ambulatory Visit: Payer: Self-pay

## 2017-11-06 DIAGNOSIS — M436 Torticollis: Secondary | ICD-10-CM

## 2017-11-06 MED ORDER — PREDNISONE 20 MG PO TABS: ORAL_TABLET | ORAL | 0 refills | Status: DC

## 2017-11-06 MED ORDER — CYCLOBENZAPRINE HCL 5 MG PO TABS: 5.0000 mg | ORAL_TABLET | Freq: Every day | ORAL | 0 refills | Status: DC

## 2017-11-06 NOTE — ED Triage Notes (Signed)
Pt reports [facility] [employer] [facility].  He woke up this morning with tightness and pain in the left posterior of his neck.  He tried warm compresses and Tylenol, which helped for a while, but then he stated it got tight again.

## 2017-11-06 NOTE — ED Provider Notes (Signed)
[facility]   [id number] 11/06/17 Arrival Time: 1848   SUBJECTIVE:  Clayton Vaughan is a 26 y.o. male who presents to the urgent care with complaint of  tightness and pain in the left posterior of his neck.  He tried warm compresses and Tylenol, which helped for a while, but then he stated it got tight again.  [facility] [employer] [facility] and woke up with the stiffness this morning.  No fever or arm numbness or weakness.  Went to work at the [employer] this morning.    Past Medical History:  Diagnosis Date  . Sickle cell trait (HCC)   . Sickle cell trait (HCC)    History reviewed. No pertinent family history. Social History   Socioeconomic History  . Marital status: Single    Spouse name: Not on file  . Number of children: Not on file  . Years of education: Not on file  . Highest education level: Not on file  Social Needs  . Financial resource strain: Not on file  . Food insecurity - worry: Not on file  . Food insecurity - inability: Not on file  . Transportation needs - medical: Not on file  . Transportation needs - non-medical: Not on file  Occupational History  . Not on file  Tobacco Use  . Smoking status: Current Every Day Smoker    Packs/day: 1.00    Types: Cigarettes  . Smokeless tobacco: Never Used  Substance and Sexual Activity  . Alcohol use: Yes  . Drug use: Yes    Types: Marijuana, Cocaine  . Sexual activity: Not on file  Other Topics Concern  . Not on file  Social History Narrative  . Not on file   No outpatient medications have been marked as taking for the 11/06/17 encounter [facility](Hospital Encounter).   No Known Allergies    ROS: As per HPI, remainder of ROS negative.   OBJECTIVE:   Vitals:   11/06/17 1900  BP: 130/88  Pulse: 84  Temp: 98.6 F (37 C)  TempSrc: Oral  SpO2: 100%     General appearance: alert; no distress Eyes: PERRL; EOMI; conjunctiva normal HENT: normocephalic; atraumatic; TMs  normal, canal normal, external ears Neck: supple;  Almost complete ROM but has some discomfort with left side lean Back: no CVA tenderness Extremities: no cyanosis or edema; symmetrical with no gross deformities Skin: warm and dry Neurologic: normal gait; grossly normal Psychological: alert and cooperative; normal mood and affect      Labs:  Results for orders placed or performed during the hospital encounter of 09/09/17  Comprehensive metabolic panel  Result Value Ref Range   Sodium 140 135 - 145 mmol/L   Potassium 3.8 3.5 - 5.1 mmol/L   Chloride 106 101 - 111 mmol/L   CO2 28 22 - 32 mmol/L   Glucose, Bld 106 (H) 65 - 99 mg/dL   BUN 9 6 - 20 mg/dL   Creatinine, Ser 6.961.21 0.61 - 1.24 mg/dL   Calcium 9.5 8.9 - 29.510.3 mg/dL   Total Protein 7.4 6.5 - 8.1 g/dL   Albumin 4.3 3.5 - 5.0 g/dL   AST 31 15 - 41 U/L   ALT 23 17 - 63 U/L   Alkaline Phosphatase 71 38 - 126 U/L   Total Bilirubin 0.7 0.3 - 1.2 mg/dL   GFR calc non Af Amer >60 >60 mL/min   GFR calc Af Amer >60 >60 mL/min   Anion gap 6  5 - 15  Ethanol  Result Value Ref Range   Alcohol, Ethyl (B) <10 <10 mg/dL  Urine rapid drug screen (hosp performed)  Result Value Ref Range   Opiates NONE DETECTED NONE DETECTED   Cocaine POSITIVE (A) NONE DETECTED   Benzodiazepines NONE DETECTED NONE DETECTED   Amphetamines NONE DETECTED NONE DETECTED   Tetrahydrocannabinol NONE DETECTED NONE DETECTED   Barbiturates NONE DETECTED NONE DETECTED  CBC with Diff  Result Value Ref Range   WBC 5.2 4.0 - 10.5 K/uL   RBC 5.19 4.22 - 5.81 MIL/uL   Hemoglobin 14.9 13.0 - 17.0 g/dL   HCT 11.943.3 14.739.0 - 82.952.0 %   MCV 83.4 78.0 - 100.0 fL   MCH 28.7 26.0 - 34.0 pg   MCHC 34.4 30.0 - 36.0 g/dL   RDW 56.215.6 (H) 13.011.5 - 86.515.5 %   Platelets 246 150 - 400 K/uL   Neutrophils Relative % 45 %   Neutro Abs 2.3 1.7 - 7.7 K/uL   Lymphocytes Relative 44 %   Lymphs Abs 2.3 0.7 - 4.0 K/uL   Monocytes Relative 8 %   Monocytes Absolute 0.4 0.1 - 1.0 K/uL    Eosinophils Relative 3 %   Eosinophils Absolute 0.1 0.0 - 0.7 K/uL   Basophils Relative 0 %   Basophils Absolute 0.0 0.0 - 0.1 K/uL    Labs Reviewed - No data to display  No results found.     ASSESSMENT & PLAN:  1. Torticollis, acute     Meds ordered this encounter  Medications  . cyclobenzaprine (FLEXERIL) 5 MG tablet    Sig: Take 1 tablet (5 mg total) by mouth at bedtime.    Dispense:  7 tablet    Refill:  0  . predniSONE (DELTASONE) 20 MG tablet    Sig: Two daily with food    Dispense:  6 tablet    Refill:  0    Reviewed expectations re: course of current medical issues. Questions answered. Outlined signs and symptoms indicating need for more acute intervention. Patient verbalized understanding. After Visit Summary given.    Procedures:      Elvina SidleLauenstein, Keelia Graybill, MD 11/06/17 1910

## 2017-12-22 ENCOUNTER — Encounter (HOSPITAL_COMMUNITY): Payer: Self-pay | Admitting: *Deleted

## 2017-12-22 ENCOUNTER — Emergency Department (HOSPITAL_COMMUNITY)
Admission: EM | Admit: 2017-12-22 | Discharge: 2017-12-22 | Discharge status: Against Medical Advice | Payer: Self-pay | Attending: Emergency Medicine | Admitting: Emergency Medicine

## 2017-12-22 ENCOUNTER — Other Ambulatory Visit: Payer: Self-pay

## 2017-12-22 DIAGNOSIS — Z5321 Procedure and treatment not carried out due to patient leaving prior to being seen by health care provider: Secondary | ICD-10-CM | POA: Insufficient documentation

## 2017-12-22 DIAGNOSIS — R51 Headache: Secondary | ICD-10-CM | POA: Insufficient documentation

## 2017-12-22 NOTE — ED Triage Notes (Signed)
Pt reports onset last night of flu like symptoms, having headache, bodyaches, fever, sore throat. Also reports n/v last night. No diarrhea. Mask on pt at triage.

## 2017-12-22 NOTE — ED Notes (Signed)
Called and no answer times 2 

## 2017-12-22 NOTE — ED Notes (Signed)
Pt called for x3.

## 2017-12-23 ENCOUNTER — Encounter (HOSPITAL_COMMUNITY): Payer: Self-pay | Admitting: Emergency Medicine

## 2017-12-23 ENCOUNTER — Inpatient Hospital Stay (HOSPITAL_COMMUNITY)
Admission: RE | Admit: 2017-12-23 | Discharge: 2017-12-31 | DRG: 885 | Disposition: A | Discharge status: Home / Self Care | Payer: Federal, State, Local not specified - Other | Attending: Psychiatry | Admitting: Psychiatry

## 2017-12-23 ENCOUNTER — Encounter (HOSPITAL_COMMUNITY): Payer: Self-pay

## 2017-12-23 ENCOUNTER — Emergency Department (HOSPITAL_COMMUNITY)
Admission: EM | Admit: 2017-12-23 | Discharge: 2017-12-23 | Disposition: A | Discharge status: Other Acute Inpatient Hospital | Payer: Self-pay | Attending: Emergency Medicine | Admitting: Emergency Medicine

## 2017-12-23 ENCOUNTER — Other Ambulatory Visit: Payer: Self-pay

## 2017-12-23 DIAGNOSIS — Z915 Personal history of self-harm: Secondary | ICD-10-CM

## 2017-12-23 DIAGNOSIS — F1410 Cocaine abuse, uncomplicated: Secondary | ICD-10-CM | POA: Diagnosis not present

## 2017-12-23 DIAGNOSIS — R45851 Suicidal ideations: Secondary | ICD-10-CM | POA: Diagnosis present

## 2017-12-23 DIAGNOSIS — F329 Major depressive disorder, single episode, unspecified: Secondary | ICD-10-CM | POA: Insufficient documentation

## 2017-12-23 DIAGNOSIS — F4310 Post-traumatic stress disorder, unspecified: Secondary | ICD-10-CM | POA: Diagnosis not present

## 2017-12-23 DIAGNOSIS — Z79899 Other long term (current) drug therapy: Secondary | ICD-10-CM | POA: Diagnosis not present

## 2017-12-23 DIAGNOSIS — R45850 Homicidal ideations: Secondary | ICD-10-CM | POA: Diagnosis present

## 2017-12-23 DIAGNOSIS — D573 Sickle-cell trait: Secondary | ICD-10-CM | POA: Diagnosis present

## 2017-12-23 DIAGNOSIS — Z23 Encounter for immunization: Secondary | ICD-10-CM | POA: Diagnosis not present

## 2017-12-23 DIAGNOSIS — Z62810 Personal history of physical and sexual abuse in childhood: Secondary | ICD-10-CM | POA: Diagnosis present

## 2017-12-23 DIAGNOSIS — F17210 Nicotine dependence, cigarettes, uncomplicated: Secondary | ICD-10-CM | POA: Insufficient documentation

## 2017-12-23 DIAGNOSIS — F1910 Other psychoactive substance abuse, uncomplicated: Secondary | ICD-10-CM | POA: Diagnosis present

## 2017-12-23 DIAGNOSIS — F419 Anxiety disorder, unspecified: Secondary | ICD-10-CM | POA: Diagnosis present

## 2017-12-23 DIAGNOSIS — F909 Attention-deficit hyperactivity disorder, unspecified type: Secondary | ICD-10-CM | POA: Diagnosis present

## 2017-12-23 DIAGNOSIS — G4700 Insomnia, unspecified: Secondary | ICD-10-CM | POA: Diagnosis present

## 2017-12-23 DIAGNOSIS — Z590 Homelessness: Secondary | ICD-10-CM | POA: Diagnosis not present

## 2017-12-23 DIAGNOSIS — F28 Other psychotic disorder not due to a substance or known physiological condition: Secondary | ICD-10-CM | POA: Insufficient documentation

## 2017-12-23 DIAGNOSIS — F250 Schizoaffective disorder, bipolar type: Principal | ICD-10-CM

## 2017-12-23 DIAGNOSIS — F29 Unspecified psychosis not due to a substance or known physiological condition: Secondary | ICD-10-CM | POA: Insufficient documentation

## 2017-12-23 DIAGNOSIS — F1210 Cannabis abuse, uncomplicated: Secondary | ICD-10-CM | POA: Diagnosis not present

## 2017-12-23 HISTORY — DX: Other psychoactive substance abuse, uncomplicated: F19.10

## 2017-12-23 HISTORY — DX: Auditory hallucinations: R44.0

## 2017-12-23 HISTORY — DX: Other psychoactive substance use, unspecified with psychoactive substance-induced mood disorder: F19.94

## 2017-12-23 HISTORY — DX: Cocaine abuse, uncomplicated: F14.10

## 2017-12-23 LAB — CBC
HCT: 45.9 % (ref 39.0–52.0)
Hemoglobin: 16.3 g/dL (ref 13.0–17.0)
MCH: 29.2 pg (ref 26.0–34.0)
MCHC: 35.5 g/dL (ref 30.0–36.0)
MCV: 82.1 fL (ref 78.0–100.0)
PLATELETS: 237 10*3/uL (ref 150–400)
RBC: 5.59 MIL/uL (ref 4.22–5.81)
RDW: 13.9 % (ref 11.5–15.5)
WBC: 7.3 10*3/uL (ref 4.0–10.5)

## 2017-12-23 LAB — COMPREHENSIVE METABOLIC PANEL
ALBUMIN: 4.6 g/dL (ref 3.5–5.0)
ALT: 24 U/L (ref 17–63)
ANION GAP: 14 (ref 5–15)
AST: 39 U/L (ref 15–41)
Alkaline Phosphatase: 58 U/L (ref 38–126)
BUN: 14 mg/dL (ref 6–20)
CALCIUM: 9.8 mg/dL (ref 8.9–10.3)
CO2: 27 mmol/L (ref 22–32)
Chloride: 98 mmol/L — ABNORMAL LOW (ref 101–111)
Creatinine, Ser: 1.39 mg/dL — ABNORMAL HIGH (ref 0.61–1.24)
GFR calc non Af Amer: >60 mL/min (ref >60)
GLUCOSE: 83 mg/dL (ref 65–99)
POTASSIUM: 3.3 mmol/L — AB (ref 3.5–5.1)
SODIUM: 139 mmol/L (ref 135–145)
TOTAL PROTEIN: 7.9 g/dL (ref 6.5–8.1)
Total Bilirubin: 1.2 mg/dL (ref 0.3–1.2)

## 2017-12-23 LAB — RAPID URINE DRUG SCREEN, HOSP PERFORMED
Amphetamines: NOT DETECTED
Barbiturates: NOT DETECTED
Benzodiazepines: NOT DETECTED
Cocaine: POSITIVE — AB
Opiates: POSITIVE — AB
TETRAHYDROCANNABINOL: POSITIVE — AB

## 2017-12-23 LAB — SALICYLATE LEVEL

## 2017-12-23 LAB — ACETAMINOPHEN LEVEL: Acetaminophen (Tylenol), Serum: <10 ug/mL — ABNORMAL LOW (ref 10–30)

## 2017-12-23 LAB — ETHANOL: Alcohol, Ethyl (B): 18 mg/dL — ABNORMAL HIGH (ref <10)

## 2017-12-23 MED ORDER — HYDROXYZINE HCL 25 MG PO TABS
25.0000 mg | ORAL_TABLET | Freq: Three times a day (TID) | ORAL | Status: DC | PRN
  Administered 2017-12-23 – 2017-12-30 (×4): 25 mg via ORAL
  Filled 2017-12-23 (×3): qty 1
  Filled 2017-12-23: qty 20
  Filled 2017-12-23: qty 1
  Filled 2017-12-23: qty 20
  Filled 2017-12-23: qty 1

## 2017-12-23 MED ORDER — ACETAMINOPHEN 325 MG PO TABS
650.0000 mg | ORAL_TABLET | Freq: Four times a day (QID) | ORAL | Status: DC | PRN
  Administered 2017-12-23 – 2017-12-24 (×2): 650 mg via ORAL
  Filled 2017-12-23 (×2): qty 2

## 2017-12-23 MED ORDER — PNEUMOCOCCAL VAC POLYVALENT 25 MCG/0.5ML IJ INJ
0.5000 mL | INJECTION | INTRAMUSCULAR | Status: AC
  Administered 2017-12-24: 0.5 mL via INTRAMUSCULAR

## 2017-12-23 MED ORDER — INFLUENZA VAC SPLIT QUAD 0.5 ML IM SUSY
0.5000 mL | PREFILLED_SYRINGE | INTRAMUSCULAR | Status: AC
  Administered 2017-12-24: 0.5 mL via INTRAMUSCULAR
  Filled 2017-12-23: qty 0.5

## 2017-12-23 MED ORDER — MAGNESIUM HYDROXIDE 400 MG/5ML PO SUSP: 30.0000 mL | Freq: Every day | ORAL | Status: DC | PRN

## 2017-12-23 MED ORDER — POTASSIUM CHLORIDE CRYS ER 20 MEQ PO TBCR
40.0000 meq | EXTENDED_RELEASE_TABLET | Freq: Once | ORAL | Status: AC
  Administered 2017-12-23: 40 meq via ORAL
  Filled 2017-12-23: qty 2

## 2017-12-23 MED ORDER — NICOTINE 21 MG/24HR TD PT24
21.0000 mg | MEDICATED_PATCH | Freq: Every day | TRANSDERMAL | Status: DC
  Administered 2017-12-23 – 2017-12-30 (×8): 21 mg via TRANSDERMAL
  Filled 2017-12-23 (×13): qty 1

## 2017-12-23 MED ORDER — TRAZODONE HCL 50 MG PO TABS
50.0000 mg | ORAL_TABLET | Freq: Every evening | ORAL | Status: DC | PRN
  Administered 2017-12-23 – 2017-12-28 (×7): 50 mg via ORAL
  Filled 2017-12-23 (×6): qty 1

## 2017-12-23 MED ORDER — ALUM & MAG HYDROXIDE-SIMETH 200-200-20 MG/5ML PO SUSP: 30.0000 mL | ORAL | Status: DC | PRN

## 2017-12-23 NOTE — ED Triage Notes (Signed)
Pt reports feeling SI/HI X several weeks, pt does not have an exact plan and is not HI towards a specific person. Pt is calm and cooperative in triage.

## 2017-12-23 NOTE — BH Assessment (Signed)
Reassessment Note: Pt in scrubs, sitting on side of his hospital bed. Pt was calm and eye contact was fair. Pt reports feeling "pretty much" the same as he did yesterday. Pt is concerned he is losing control of himself and is concerned for the safety of others and himself. Pt reports he is hearing voices and that he always has them. He states they are not due to drugs or that he is crazy.  Continued recommendation for Inpt tx.

## 2017-12-23 NOTE — Tx Team (Signed)
Initial Treatment Plan 12/23/2017 4:53 PM Clayton BandyDennis A Fields Vaughan ZHY:[id number]    PATIENT STRESSORS: Financial difficulties Marital or family conflict Substance abuse   PATIENT STRENGTHS: Wellsite geologistCommunication skills General fund of knowledge Physical Health   PATIENT IDENTIFIED PROBLEMS: Agitation  Psychosis  "Get myself some space"  "I want to keep myself safe and not do something stupid."               DISCHARGE CRITERIA:  Improved stabilization in mood, thinking, and/or behavior Verbal commitment to aftercare and medication compliance  PRELIMINARY DISCHARGE PLAN: Outpatient therapy Medication management  PATIENT/FAMILY INVOLVEMENT: This treatment plan has been presented to and reviewed with the patient, Clayton Vaughan.  The patient and family have been given the opportunity to ask questions and make suggestions.  Levin BaconHeather V Derriona Branscom, RN 12/23/2017, 4:53 PM

## 2017-12-23 NOTE — Progress Notes (Addendum)
LCSW following for disposition: inpatient psychiatric recommendation. The follow ref[facility] been [facility]: [facility]   1:16 PM [facility].  Patient will becoming voluntary and going to: Room: 504-1 [phone]  Patient can arrive by [place] after 2:30pm.  RN Kriste BasqueBecky has been contacted and made aware of plan.  LCSW will follow up on referrals once reviewed. Clayton EmoryHannah Amyra Vantuyl LCSW, MSW Clinical Social Work: Optician, dispensingystem Wide Float Coverage for :  [phone]

## 2017-12-23 NOTE — ED Triage Notes (Signed)
Pt placed in paper scrubs, belongings removed. Pt wanded by security.  Per staffing no sitter until 0700. Charge RN aware

## 2017-12-23 NOTE — ED Notes (Signed)
Patient was given a snack and Drink, and A Regular Diet was ordered for Lunch. 

## 2017-12-23 NOTE — ED Notes (Signed)
TTS placed at bedside.  

## 2017-12-23 NOTE — ED Notes (Signed)
Regular breakfast tray ordered.  

## 2017-12-23 NOTE — Discharge Instructions (Signed)
[facility].
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
## 2017-12-23 NOTE — BH Assessment (Signed)
Reassessment note- Per Becky, RN, Pt is sleeping at this time. Will retry seeing pt at later time.

## 2017-12-23 NOTE — ED Notes (Signed)
Per staffing, sitter available at 0700 

## 2017-12-23 NOTE — ED Provider Notes (Signed)
[facility] EM[facility]GENCY [facility] Provider Note   CSN: [id number] Arrival date & time: 12/23/17  0107     History   Chief Complaint Chief Complaint  Patient presents with  . Suicidal  . Homicidal  . Depression    HPI Clayton Vaughan is a 27 y.o. male.   27 year old male presents to the emergency department for psychiatric evaluation.  He initially presented earlier today for complaints of flulike illness.  He later returned reporting suicidal and homicidal thoughts.  Patient states that he is currently homeless and without a job.  He states that he feels as though some of his friends and family members are trying to bring him down.  He denies any specific homicidal ideations or plan, but does report ruminating on thoughts of "killing them".  He reports crack cocaine use today as well as drinking 2 beers.  He specifically denies suicidal ideations during my encounter with him.      Past Medical History:  Diagnosis Date  . Sickle cell trait (HCC)   . Sickle cell trait [facility](HCC)     Patient Active Problem List   Diagnosis Date Noted  . Auditory hallucinations 09/05/2017  . Polysubstance abuse (HCC) 09/04/2017  . Substance induced mood disorder (HCC) 09/04/2017  . Cocaine abuse (HCC) 06/26/2017    History reviewed. No pertinent surgical history.     Home Medications    Prior to Admission medications   Medication Sig Start Date End Date Taking? Authorizing Provider  cyclobenzaprine (FLEXERIL) 5 MG tablet Take 1 tablet (5 mg total) by mouth at bedtime. 11/06/17   Elvina SidleLauenstein, Kurt, MD  predniSONE (DELTASONE) 20 MG tablet Two daily with food 11/06/17   Elvina SidleLauenstein, Kurt, MD    Family History No family history on file.  Social History Social History   Tobacco Use  . Smoking status: Current Every Day Smoker    Packs/day: 1.00    Types: Cigarettes  . Smokeless tobacco: Never Used  Substance Use Topics  . Alcohol use: Yes  . Drug use: Yes   Types: Marijuana, Cocaine     Allergies   Patient has no known allergies.   Review of Systems Review of Systems Ten systems reviewed and are negative for acute change, except as noted in the HPI.    Physical Exam Updated Vital Signs BP (!) 138/97   Pulse 84   Temp 98.6 F (37 C) (Oral)   Resp 18   Ht 6\' 2"  (1.88 m)   Wt 77.1 kg (170 lb)   SpO2 100%   BMI 21.83 kg/m   Physical Exam  Constitutional: He is oriented to person, place, and time. He appears well-developed and well-nourished. No distress.  Nontoxic appearing and in no acute distress  HENT:  Head: Normocephalic and atraumatic.  Gross dental decay  Eyes: Conjunctivae and EOM are normal. No scleral icterus.  Neck: Normal range of motion.  Pulmonary/Chest: Effort normal. No stridor. No respiratory distress.  Respirations even and unlabored  Musculoskeletal: Normal range of motion.  Neurological: He is alert and oriented to person, place, and time. He exhibits normal muscle tone. Coordination normal.  Skin: Skin is warm and dry. No rash noted. He is not diaphoretic. No erythema. No pallor.  Psychiatric: He has a normal mood and affect. His behavior is normal.  Vague homicidal ideations without plan  Nursing note and vitals reviewed.    ED Treatments / Results  Labs (all labs ordered are listed, but only abnormal results are  displayed) Labs Reviewed  COMPREHENSIVE METABOLIC PANEL - Abnormal; Notable for the following components:      Result Value   Potassium 3.3 (*)    Chloride 98 (*)    Creatinine, Ser 1.39 (*)    All other components within normal limits  ETHANOL - Abnormal; Notable for the following components:   Alcohol, Ethyl (B) 18 (*)    All other components within normal limits  ACETAMINOPHEN LEVEL - Abnormal; Notable for the following components:   Acetaminophen (Tylenol), Serum <10 (*)    All other components within normal limits  RAPID URINE DRUG SCREEN, HOSP PERFORMED - Abnormal; Notable for  the following components:   Opiates POSITIVE (*)    Cocaine POSITIVE (*)    Tetrahydrocannabinol POSITIVE (*)    All other components within normal limits  SALICYLATE LEVEL  CBC    EKG  EKG Interpretation None       Radiology No results found.  Procedures Procedures (including critical care time)  Medications Ordered in ED Medications - No data to display   Initial Impression / Assessment and Plan / ED Course  I have reviewed the triage vital signs and the nursing notes.  Pertinent labs & imaging results that were available during my care of the patient were reviewed by me and considered in my medical decision making (see chart for details).     27 year old male presents to the emergency department today for psychiatric evaluation.  He reports vague HI without plan. He has been recommended for inpatient treatment.  TTS to seek placement.  Disposition to be determined by oncoming ED provider.  Patient medically cleared.   Final Clinical Impressions(s) / ED Diagnoses   Final diagnoses:  Other psychotic disorder not due to substance or known physiological condition [facility])    ED Discharge Orders    None       Antony Madura, PA-C 12/23/17 0525    Ward, Layla Maw, DO 12/23/17 [phone]

## 2017-12-23 NOTE — ED Notes (Signed)
Kelly, PA at bedside. 

## 2017-12-23 NOTE — Progress Notes (Signed)
Clayton Vaughan is a [age]year old male being admitted voluntarily to [facility] from [facility].  He came in for depression, homicidal ideation and problems dealing with his anger.  He also reported having auditory hallucinations.  During Clayton Vaughan admission, he was living at [facility], felt like the people there were holding him back, became upset and threatening to kill staff.   He had started drinking again, using cocaine and marijuana.  He currently denies HI but continues to hear voices but "they are nice telling me positive things, they have never been negative."  He denies any pain or discomfort and appears to be in no physical distress.  Oriented him to the unit.  Admission paperwork completed and signed.  Belongings searched and secured in locker #37, no contraband found.  Skin assessment completed and no skin issues noted.  Q 15 minute checks initiated for safety.  We will continue to monitor the progress towards his goals.

## 2017-12-23 NOTE — ED Provider Notes (Signed)
BH team indicates patient accepted [facility].   Pt alert, nad.  Vitals:   12/23/17 1209 12/23/17 1439  BP: 136/88 127/80  Pulse: 71 71  Resp: 18 18  Temp: 98.1 F (36.7 C) 98.7 F (37.1 C)  SpO2: 100% 100%   Pt appears stable for transport.      Cathren LaineSteinl, Ermalee Mealy, MD 12/23/17 1440

## 2017-12-23 NOTE — ED Notes (Signed)
Pt had voiced agreement w/tx plan [facility] - signed [facility] forms - [facility], copy sent to Medical Records, and [facility].

## 2017-12-24 DIAGNOSIS — F17210 Nicotine dependence, cigarettes, uncomplicated: Secondary | ICD-10-CM

## 2017-12-24 DIAGNOSIS — Z62810 Personal history of physical and sexual abuse in childhood: Secondary | ICD-10-CM

## 2017-12-24 DIAGNOSIS — F250 Schizoaffective disorder, bipolar type: Secondary | ICD-10-CM

## 2017-12-24 DIAGNOSIS — F4310 Post-traumatic stress disorder, unspecified: Secondary | ICD-10-CM

## 2017-12-24 MED ORDER — IBUPROFEN 600 MG PO TABS
600.0000 mg | ORAL_TABLET | Freq: Four times a day (QID) | ORAL | Status: DC | PRN
  Administered 2017-12-24: 600 mg via ORAL
  Filled 2017-12-24: qty 1

## 2017-12-24 MED ORDER — RISPERIDONE 1 MG PO TABS
1.0000 mg | ORAL_TABLET | Freq: Two times a day (BID) | ORAL | Status: DC
  Administered 2017-12-24 – 2017-12-25 (×3): 1 mg via ORAL
  Filled 2017-12-24 (×6): qty 1

## 2017-12-24 MED ORDER — DIVALPROEX SODIUM 500 MG PO DR TAB
500.0000 mg | DELAYED_RELEASE_TABLET | Freq: Two times a day (BID) | ORAL | Status: DC
  Administered 2017-12-24 – 2017-12-30 (×14): 500 mg via ORAL
  Filled 2017-12-24 (×11): qty 1
  Filled 2017-12-24: qty 28
  Filled 2017-12-24 (×3): qty 1
  Filled 2017-12-24: qty 28
  Filled 2017-12-24 (×2): qty 1

## 2017-12-24 NOTE — Progress Notes (Signed)
Nursing Progress Note 1900-0730  D) Patient presents with depressed mood and flat affect. Patient did attend group. Patient reports passive SI with no plan but denies HI/AVH or pain. Patient contracts for safety on the unit. Patient compliant with medications. Patient denies concerns for writer this evening and is isolative to his room.  A) Patient educated about and provided medication as scheduled or requested per provider's orders. Patient safety maintained with q15 min safety checks. Low fall risk precautions in place. Emotional support given. 1:1 interaction and active listening provided. Snacks and fluids provided. Labs, vital signs and patient behavior monitored throughout shift. Patient encouraged to work on treatment plan.  R) Patient remains safe on the unit at this time. Patient agrees to make needs known to staff. Will continue to monitor and assess for changes.
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
## 2017-12-24 NOTE — Progress Notes (Signed)
Adult Psychoeducational Group Note  Date:  12/24/2017 Time:  11:24 PM  Group Topic/Focus:  Wrap-Up Group:   The focus of this group is to help patients review their daily goal of treatment and discuss progress on daily workbooks.  Participation Level:  Active  Participation Quality:  Appropriate, Attentive and Sharing  Affect:  Appropriate  Cognitive:  Alert, Appropriate and Oriented  Insight: Good  Engagement in Group:  Engaged  Modes of Intervention:  Discussion  Additional Comments:  Pt stated his goal for the day was to be honest, share his problems, and to seek help from people. He stated he accomplished his goal. He rated his day a 7.5/10 and stated he will try the same things tomorrow and for the staff to keep up the good work.  Aundra MilletMegan A Rilda Bulls 12/24/2017, 11:24 PM
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
## 2017-12-24 NOTE — Progress Notes (Signed)
DAR Note   Pt observed in the dayroom not interaction. Pt endorsed AH and anxiety; "I just have to get my head straight." Pt denied SI, HI, pain, or depression. Pt does not look to be in any distress at the time of assessment. Support, encouragement, and safe environment provided. 15-minute safety checks continue. Pt was med compliant. Safety checks continue. Pt did attend wrap-up group.

## 2017-12-24 NOTE — Tx Team (Signed)
Interdisciplinary Treatment and Diagnostic Plan Update  12/24/2017 Time of Session: 1:09 PM  Clayton Vaughan MRN: [id number]  Principal Diagnosis: Schizoaffective disorder, bipolar type (HCC)  Secondary Diagnoses: Active Problems:   Psychotic disorder (HCC)   Current Medications:  Current Facility-Administered Medications  Medication Dose Route Frequency Provider Last Rate Last Dose  . acetaminophen (TYLENOL) tablet 650 mg  650 mg Oral Q6H PRN Okonkwo, Justina A, NP   650 mg at 12/24/17 0647  . alum & mag hydroxide-simeth (MAALOX/MYLANTA) 200-200-20 MG/5ML suspension 30 mL  30 mL Oral Q4H PRN Okonkwo, Justina A, NP      . hydrOXYzine (ATARAX/VISTARIL) tablet 25 mg  25 mg Oral TID PRN Beryle Lathe, Justina A, NP   25 mg at 12/23/17 2154  . ibuprofen (ADVIL,MOTRIN) tablet 600 mg  600 mg Oral Q6H PRN Micheal Likens, MD   600 mg at 12/24/17 0944  . magnesium hydroxide (MILK OF MAGNESIA) suspension 30 mL  30 mL Oral Daily PRN Okonkwo, Justina A, NP      . nicotine (NICODERM CQ - dosed in mg/24 hours) patch 21 mg  21 mg Transdermal Daily Micheal Likens, MD   21 mg at 12/24/17 0944  . traZODone (DESYREL) tablet 50 mg  50 mg Oral QHS PRN Beryle Lathe, Justina A, NP   50 mg at 12/23/17 2154    PTA Medications: Medications Prior to Admission  Medication Sig Dispense Refill Last Dose  . cyclobenzaprine (FLEXERIL) 5 MG tablet Take 1 tablet (5 mg total) by mouth at bedtime. (Patient not taking: Reported on 12/23/2017) 7 tablet 0 Not Taking at Unknown time    Treatment Modalities: Medication Management, Group therapy, Case management,  1 to 1 session with clinician, Psychoeducation, Recreational therapy.  Patient Stressors: Financial difficulties Marital or family conflict Substance abuse  Patient Strengths: Wellsite geologist fund of knowledge Physical Health   Physician Treatment Plan for Primary Diagnosis: Schizoaffective disorder, bipolar type (HCC) Long Term  Goal(s): Improvement in symptoms so as ready for discharge  Short Term Goals:    Medication Management: Evaluate patient's response, side effects, and tolerance of medication regimen.  Therapeutic Interventions: 1 to 1 sessions, Unit Group sessions and Medication administration.  Evaluation of Outcomes: Progressing  Physician Treatment Plan for Secondary Diagnosis: Principal Problem:   Schizoaffective disorder, bipolar type (HCC)  Long Term Goal(s): Improvement in symptoms so as ready for discharge  Short Term Goals:    Medication Management: Evaluate patient's response, side effects, and tolerance of medication regimen.  Therapeutic Interventions: 1 to 1 sessions, Unit Group sessions and Medication administration.  Evaluation of Outcomes: Progressing   RN Treatment Plan for Primary Diagnosis: Schizoaffective disorder, bipolar type (HCC) Long Term Goal(s): Knowledge of disease and therapeutic regimen to maintain health will improve  Short Term Goals: Ability to verbalize frustration and anger appropriately will improve, Ability to demonstrate self-control, Ability to identify and develop effective coping behaviors will improve and Compliance with prescribed medications will improve  Medication Management: RN will administer medications as ordered by provider, will assess and evaluate patient's response and provide education to patient for prescribed medication. RN will report any adverse and/or side effects to prescribing provider.  Therapeutic Interventions: 1 on 1 counseling sessions, Psychoeducation, Medication administration, Evaluate responses to treatment, Monitor vital signs and CBGs as ordered, Perform/monitor CIWA, COWS, AIMS and Fall Risk screenings as ordered, Perform wound care treatments as ordered.  Evaluation of Outcomes: Progressing   LCSW Treatment Plan for Primary Diagnosis: Schizoaffective disorder, bipolar type (  HCC) Long Term Goal(s): Safe transition to  appropriate next level of care at discharge, Engage patient in therapeutic group addressing interpersonal concerns.  Short Term Goals: Engage patient in aftercare planning with referrals and resources, Increase ability to appropriately verbalize feelings, Facilitate acceptance of mental health diagnosis and concerns, Facilitate patient progression through stages of change regarding substance use diagnoses and concerns, Identify triggers associated with mental health/substance abuse issues and Increase skills for wellness and recovery  Therapeutic Interventions: Assess for all discharge needs, 1 to 1 time with Social worker, Explore available resources and support systems, Assess for adequacy in community support network, Educate family and significant other(s) on suicide prevention, Complete Psychosocial Assessment, Interpersonal group therapy.  Evaluation of Outcomes: Progressing   Progress in Treatment: Attending groups: Yes Participating in groups: Yes Taking medication as prescribed: Yes Toleration of medication: Yes, no side effects reported at this time Family/Significant other contact made: No Patient understands diagnosis: Yes AEB asking for help with anger  Discussing patient identified problems/goals with staff: Yes Medical problems stabilized or resolved: Yes Denies suicidal/homicidal ideation: Yes Issues/concerns per patient self-inventory: None Other: N/A  New problem(s) identified: None identified at this time.   New Short Term/Long Term Goal(s): "I need help with my anger and impulsiveness".   Discharge Plan or Barriers: Upon discharge pt will go to a shelter in [place] and [facility]   Reason for Continuation of Hospitalization: Depression Hallucinations Homicidal ideation Medication stabilization   Estimated Length of Stay: 12/29/17  Attendees: Patient: Clayton Vaughan  12/24/2017  1:09 PM  Physician: Jolyne Loahristopher Rainville, MD 12/24/2017  1:09 PM   Nursing: Estella HuskElizabeth Awofabeju, RN 12/24/2017  1:09 PM  RN Care Manager: Onnie BoerJennifer Clark, RN 12/24/2017  1:09 PM  Social Worker: Richelle Itood North, LCSW; Melba CoonAngel Masiyah Engen, Social Work Intern 12/24/2017  1:09 PM  Recreational Therapist: Caroll RancherMarjette Lindsay, LRT 12/24/2017  1:09 PM  Other: Tomasita Morrowelora Sutton, P4CC 12/24/2017  1:09 PM  Other:  12/24/2017  1:09 PM  Other: 12/24/2017  1:09 PM    Scribe for Treatment Team: Aram BeechamAngel M Iyanah Demont, Student-Social Work 12/24/2017 1:09 PM

## 2017-12-24 NOTE — Progress Notes (Signed)
D: Patient observed resting in bed. Patient states he started to have a headache last evening before dinner. Presently rates it as an 8/10 and received tylenol earlier this morning. "I don't know what it is. I'm drinking fluids. It's not withdrawal because nothing else in my body is bothering me." VSS, afebrile. Patient's affect flat however when asked about hallucinations patient begins to smile and laugh softly.  Mood depressed. Per self inventory and discussions with writer, rates depression at a 7/10, hopelessness at a 4/10 and anxiety at a 3/10. Rates sleep as fair, appetite as good, energy as low and concentration as good.  States goal for today is to "try to stay open about myself to see if there is something I'm missing on why I fel like this."  A: Medicated per orders, prn advil given for increased headache which patient rated at a 10/10 mid morning. Flu and pneumonia vaccines given without difficulty. Level III obs in place for safety. Emotional support offered and self inventory reviewed. Encouraged completion of Suicide Safety Plan and programming participation. Discussed POC with MD, SW.  R: Patient verbalizes understanding of POC. On reassess of prn, patient was asleep. Patient denies SI/HI stating, "well, not right now." Verbal contract in place for safety.  Denies VH and remains safe on level III obs. Will continue to monitor closely and make verbal contact frequently.

## 2017-12-24 NOTE — [facility] Group Notes (Signed)
[facility] Group Notes:  (Nursing/MHT/Case Management/Adjunct)  Date:  12/24/2017  Time:  1600  Type of Therapy:  Nurse Education  - Identifying Needs to Promote Wellness  Participation Level:  Active  Participation Quality:  Attentive  Affect:  Appropriate and Blunted  Cognitive:  Alert  Insight:  Improving  Engagement in Group:  Engaged  Modes of Intervention:  Education and Exploration  Summary of Progress/Problems: Educated patients on [employer]. Led exercise helping patients identify times that we were happy and times they were proud. Help them examine what needs were met. Patient was engaged and shared his family used to be his biggest source of both happiness and pride however now he is mostly estranged from them. Stated he hopes to have a family of his own one day.  Loletta Specter [facility] 12/24/2017, 4:42 PM

## 2017-12-24 NOTE — [facility] Group Notes (Signed)
LCSW Group Therapy Note   12/24/2017 1:15pm   Type of Therapy and Topic:  Group Therapy:  Trust and Honesty  Participation Level:  Active  Description of Group:    In this group patients will be asked to explore the value of being honest.  Patients will be guided to discuss their thoughts, feelings, and behaviors related to honesty and trusting in others. Patients will process together how trust and honesty relate to forming relationships with peers, family members, and self. Each patient will be challenged to identify and express feelings of being vulnerable. Patients will discuss reasons why people are dishonest and identify alternative outcomes if one was truthful (to self or others). This group will be process-oriented, with patients participating in exploration of their own experiences, giving and receiving support, and processing challenge from other group members.   Therapeutic Goals: 1. Patient will identify why honesty is important to relationships and how honesty overall affects relationships.  2. Patient will identify a situation where they lied or were lied too and the  feelings, thought process, and behaviors surrounding the situation 3. Patient will identify the meaning of being vulnerable, how that feels, and how that correlates to being honest with self and others. 4. Patient will identify situations where they could have told the truth, but instead lied and explain reasons of dishonesty.   Summary of Patient Progress  Stayed the entire time, engaged throughout.  "honesty is the truth."  "There's been a time I was not honest with myself and with others.  I got caught in lies and had to try to reword things, which did not work.  So I eventually took responsibility and then decided it is easier in the long run to be honest."  Clayton SorrowLatrer talked about other people and their dishonesty. "When I lied because they did, it just drug me down to their level.  I didn't like that."  Later, "I want  to know the truth so that I can deal with it and move on."  Therapeutic Modalities:   Cognitive Behavioral Therapy Solution Focused Therapy Motivational Interviewing Brief Therapy  Clayton RogueRodney B [name], LCSW 12/24/2017 1:23 PM

## 2017-12-24 NOTE — Progress Notes (Signed)
Recreation Therapy Notes  INPATIENT RECREATION THERAPY ASSESSMENT  Patient Details Name: Clayton Vaughan MRN: [id number] DOB: March 05, 1991 Today's Date: 12/24/2017       Information Obtained From: Patient  Able to Participate in Assessment/Interview: Yes  Patient Presentation: Alert, Groomed  Reason for Admission (Per Patient): Other (Comments)(Homicidal thoughts)  Patient Stressors: Other (Comment)(Homeless, no job, feels like he isn't making any progress)  Coping Skills:   Isolation, Write, Sports, TV, Aggression, Music, Exercise, Substance Abuse, Impulsivity, Talk, Art, Avoidance, Read, Hot Bath/Shower  Leisure Interests (2+):  Sports - Basketball, Music - Listen, Music - Other (Comment)(Rap)  Frequency of Recreation/Participation: Other (Comment)(Everyday)  Awareness of Community Resources:  Yes  Community Resources:  Engineering geologistLibrary, Magazine features editorYMCA, Other (Comment)(IRC)  Current Use: Yes(Uses the Engineering geologistlibrary and the [employer])  Expressed Interest in State Street CorporationCommunity Resource Information: Yes  Patient Main Form of Transportation: Therapist, musicublic Transportation  Patient Strengths:  Positive person  Patient Identified Areas of Improvement:  Impulsive; Anger  Current Recreation Participation:  Everyday  Patient Goal for Hospitalization:  "Try to manage what's going on inside of me becasue I've been abusing myself and that's not good"  [place]  [place]  Current [place] (including self-harm):  No  Current HI:  No  Current AVH: No  Staff Intervention Plan: Group Attendance  Consent to Intern Participation: N/A    Caroll RancherMarjette Johnnye [place], LRT/CTRS  Caroll RancherLindsay, Hellena Pridgen A 12/24/2017, 2:18 PM

## 2017-12-24 NOTE — [facility] Suicide Risk Assessment (Signed)
[facility] INPATIENT:  Family/Significant Other Suicide Prevention Education  Suicide Prevention Education:  Patient Refusal for Family/Significant Other Suicide Prevention Education: The patient Clayton Vaughan has refused to provide written consent for family/significant other to be provided Family/Significant Other Suicide Prevention Education during admission and/or prior to discharge.  Physician notified.  Metro Kungngel M Shirline Kendle 12/24/2017, 10:32 AM
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
## 2017-12-24 NOTE — Plan of Care (Signed)
Patient verbalizes understanding of information, education provided. 

## 2017-12-24 NOTE — Progress Notes (Signed)
Recreation Therapy Notes  Date: 12/24/17 Time: 1000 Location: 500 Hall Dayroom  Group Topic: Self-Esteem  Goal Area(s) Addresses:  Patient will successfully identify positive attributes about themselves.  Patient will successfully identify benefit of improved self-esteem.   Intervention: Worksheet, pencils   Activity: My Strengths and Qualities.  Patients were given a worksheet in which they were to come up with three answers for the following categories:  Things I'm good at, compliments I've received, what I like about my appearance, challenges I've overcome, I've helped others by, things that make me unique, what I value the most and times I've made others happy.  Education:  Self-Esteem, Discharge Planning.   Education Outcome: Acknowledges education/In group clarification offered/Needs additional education  Clinical Observations/Feedback: Pt did not attend group.   Caridad Silveira, LRT/CTRS         Aeric Burnham A 12/24/2017 12:01 PM 

## 2017-12-25 MED ORDER — MENTHOL 3 MG MT LOZG
1.0000 | LOZENGE | OROMUCOSAL | Status: DC | PRN
  Administered 2017-12-25: 3 mg via ORAL

## 2017-12-25 MED ORDER — PSEUDOEPHEDRINE HCL ER 120 MG PO TB12
120.0000 mg | ORAL_TABLET | Freq: Two times a day (BID) | ORAL | Status: DC | PRN
  Administered 2017-12-25 – 2017-12-28 (×4): 120 mg via ORAL
  Filled 2017-12-25 (×4): qty 1

## 2017-12-25 MED ORDER — RISPERIDONE 2 MG PO TABS
2.0000 mg | ORAL_TABLET | Freq: Every day | ORAL | Status: DC
  Administered 2017-12-26 – 2017-12-28 (×3): 2 mg via ORAL
  Filled 2017-12-25 (×5): qty 1

## 2017-12-25 NOTE — [facility] Group Notes (Signed)
Adult Psychoeducational Group Note  Date:  12/25/2017 Time:  8:44 PM  Group Topic/Focus:  Wrap-Up Group:   The focus of this group is to help patients review their daily goal of treatment and discuss progress on daily workbooks.  Participation Level:  Active  Participation Quality:  Appropriate and Attentive  Affect:  Appropriate  Cognitive:  Alert and Appropriate  Insight: Appropriate and Good  Engagement in Group:  Engaged  Modes of Intervention:  Discussion and Education  Additional Comments:  Pt attended and participated in wrap up group this evening. Pt noted that they had a good day because they were not angry today. Pt did not have a goal, but is feeling great that they were not angry today. A positive noted by the pt was that they were not angry today.   Clayton NettersOctavia A Cosimo Vaughan 12/25/2017, 8:44 PM

## 2017-12-25 NOTE — Progress Notes (Signed)
Recreation Therapy Notes  12/25/17 1458:  LRT met with pt to discuss anger.  LRT gave and went over a worksheet pt to read about anger.  LRT went over how to identify his triggers, warning signs for his anger and coping skills for dealing with anger.  LRT also gave pt another worksheet in which pt was to rank his triggers from 1-10, 10 being the worse.  Pt was to then identify at least 10 coping skills for his anger.  LRT will follow up with pt tomorrow to review worksheet.   Clayton Vaughan, LRT/CTRS    Ria Comment, Dellia Donnelly A 12/25/2017 3:01 PM

## 2017-12-25 NOTE — Plan of Care (Signed)
Pt is taking his medications as prescribed and attending groups on the unit.

## 2017-12-25 NOTE — [facility] Group Notes (Signed)
[facility] LCSW Group Therapy 12/25/2017 1:15pm  Type of Therapy: Group Therapy- Feelings Around Discharge & Establishing a Supportive Framework  Participation Level:  Active  Description of Group:   What is a supportive framework? What does it look like feel like and how do I discern it from and unhealthy non-supportive network? Learn how to cope when supports are not helpful and don't support you. Discuss what to do when your family/friends are not supportive.  Summary of Patient Progress  When asked what he feels strengh is he replied that it is everything he sees and feels in his heart. When asked about the word apology he spoke about being invested in repairing a past relationship.  He appeared happy and speaks often about making changes in his life.   Therapeutic Modalities:   Cognitive Behavioral Therapy Person-Centered Therapy Motivational Interviewing   Aram Beechamngel M Clarabel Marion, Student-Social Work 12/25/2017 10:48 AM

## 2017-12-25 NOTE — [facility] Group Notes (Signed)
[facility] Group Notes:  (Nursing/MHT/Case Management/Adjunct)  Date:  12/25/2017  Time:  4:49 PM  Type of Therapy:  Psychoeducational Skills  Participation Level:  Active  Participation Quality:  Appropriate and Attentive  Affect:  Appropriate  Cognitive:  Alert and Appropriate  Insight:  Appropriate and Good  Engagement in Group:  Engaged and Improving  Modes of Intervention:  Discussion and Education  Summary of Progress/Problems:Topic was on Managing crisis.  Patient was attentive and participated.  Clayton Vaughan 12/25/2017, 4:49 PM

## 2017-12-25 NOTE — Progress Notes (Signed)
Patient ID: Lowella Bandyennis A Fields Wilcox, male   DOB: [birth year], 27 y.o.   MRN: [id number]  Pt currently presents with an animated affect and cooperative, flirtatious behavior. Pt behavior somewhat intrusive, remains at nurses station and asks writer "for a hug." Pt reports to writer that their goal is to "get some sleep." Pt states "I had a better day today." Pt reports intermitent sleep with current medication regimen.   Pt provided with medications per providers orders. Pt's labs and vitals were monitored throughout the night. Pt given a 1:1 about emotional and mental status. Pt supported and encouraged to express concerns and questions. Pt educated on medications.  Pt's safety ensured with 15 minute and environmental checks. Pt currently denies SI/HI and A/V hallucinations "right now". Pt verbally agrees to seek staff if SI/HI or A/VH occurs and to consult with staff before acting on any harmful thoughts. Will continue POC.

## 2017-12-25 NOTE — Progress Notes (Signed)
[facility] MD Progress Note  12/25/2017 1:07 PM Jospeh Vaughan  MRN:  [id number] Subjective:    Clayton Vaughan is a 27 y/o M with history of treatment for ADHD as an adolescent and history of polysubstance abuse who was admitted voluntarily with worsening symptoms of depression, SI without plan, HI, AH, VH, and worsening illicit substance use. Pt had relapse of use of alcohol, cannabis, cocaine, and prescription opiate pills in the context of recent homelessness when he was asked to leave the sober living environment at [facility]. Pt agreed to trial of depakote and risperdal, and he agreed to meet with SW team about substance use treatment options.  Today upon evaluation, pt shares, "I'm actually feeling pretty good today." He shares that his mood has improved as well as his outlook on his future, now that he has spoken with SW team about treatment at [facility]. Pt shares that he has been accepted to [facility] for next Tuesday, and he is looking forward to substance use treatment. He reports AH have resolved as of last night, and he denies SI/HI/VH. He is sleeping well. His appetite is good. He notes that he felt slightly drowsy after AM dose of risperdal, and he is in agreement to change risperdal to be given only at bedtime. Pt was in agreement with the above plan, and he had no further questions, comments, or concerns.   Principal Problem: Schizoaffective disorder, bipolar type (HCC) Diagnosis:   Patient Active Problem List   Diagnosis Date Noted  . Schizoaffective disorder, bipolar type (HCC) [F25.0] 12/24/2017  . Psychotic disorder (HCC) [F29] 12/23/2017  . Auditory hallucinations [R44.0] 09/05/2017  . Polysubstance abuse (HCC) [F19.10] 09/04/2017  . Substance induced mood disorder (HCC) [F19.94] 09/04/2017  . Cocaine abuse (HCC) [F14.10] 06/26/2017   Total Time spent with patient: 30 minutes  Past Psychiatric History: see H&P  Past Medical History:  Past Medical History:  Diagnosis  Date  . Auditory hallucinations   . Cocaine abuse (HCC)   . Polysubstance abuse (HCC)   . Sickle cell trait (HCC)   . Sickle cell trait (HCC)   . Substance induced mood disorder (HCC)    History reviewed. No pertinent surgical history. Family History: History reviewed. No pertinent family history. Family Psychiatric  History: see H&P Social History:  Social History   Substance and Sexual Activity  Alcohol Use Yes     Social History   Substance and Sexual Activity  Drug Use Yes  . Types: Marijuana, Cocaine    Social History   Socioeconomic History  . Marital status: Single    Spouse name: None  . Number of children: None  . Years of education: None  . Highest education level: None  Social Needs  . Financial resource strain: None  . Food insecurity - worry: None  . Food insecurity - inability: None  . Transportation needs - medical: None  . Transportation needs - non-medical: None  Occupational History  . None  Tobacco Use  . Smoking status: Current Every Day Smoker    Packs/day: 1.00    Types: Cigarettes  . Smokeless tobacco: Never Used  Substance and Sexual Activity  . Alcohol use: Yes  . Drug use: Yes    Types: Marijuana, Cocaine  . Sexual activity: None  Other Topics Concern  . None  Social History Narrative  . None   Additional Social History:  Sleep: Good  Appetite:  Good  Current Medications: Current Facility-Administered Medications  Medication Dose Route Frequency Provider Last Rate Last Dose  . acetaminophen (TYLENOL) tablet 650 mg  650 mg Oral Q6H PRN Okonkwo, Justina A, NP   650 mg at 12/24/17 0647  . alum & mag hydroxide-simeth (MAALOX/MYLANTA) 200-200-20 MG/5ML suspension 30 mL  30 mL Oral Q4H PRN Okonkwo, Justina A, NP      . divalproex (DEPAKOTE) DR tablet 500 mg  500 mg Oral Q12H Micheal Likensainville, Macrina Lehnert T, MD   500 mg at 12/25/17 0817  . hydrOXYzine (ATARAX/VISTARIL) tablet 25 mg  25 mg Oral TID PRN  Ferne Reuskonkwo, Justina A, NP   25 mg at 12/23/17 2154  . ibuprofen (ADVIL,MOTRIN) tablet 600 mg  600 mg Oral Q6H PRN Micheal Likensainville, Sandhya Denherder T, MD   600 mg at 12/24/17 0944  . magnesium hydroxide (MILK OF MAGNESIA) suspension 30 mL  30 mL Oral Daily PRN Okonkwo, Justina A, NP      . menthol-cetylpyridinium (CEPACOL) lozenge 3 mg  1 lozenge Oral PRN Micheal Likensainville, Eliot Popper T, MD   3 mg at 12/25/17 1121  . nicotine (NICODERM CQ - dosed in mg/24 hours) patch 21 mg  21 mg Transdermal Daily Micheal Likensainville, Pegah Segel T, MD   21 mg at 12/25/17 0818  . pseudoephedrine (SUDAFED) 12 hr tablet 120 mg  120 mg Oral Q12H PRN Micheal Likensainville, Compton Brigance T, MD   120 mg at 12/25/17 1120  . [START ON 12/26/2017] risperiDONE (RISPERDAL) tablet 2 mg  2 mg Oral QHS Micheal Likensainville, Margherita Collyer T, MD      . traZODone (DESYREL) tablet 50 mg  50 mg Oral QHS PRN Beryle Lathekonkwo, Justina A, NP   50 mg at 12/24/17 2103    Lab Results: No results found for this or any previous visit (from the past 48 hour(s)).  Blood Alcohol level:  Lab Results  Component Value Date   ETH 18 (H) 12/23/2017   ETH <10 09/09/2017    Metabolic Disorder Labs: No results found for: HGBA1C, MPG No results found for: PROLACTIN No results found for: CHOL, TRIG, HDL, CHOLHDL, VLDL, LDLCALC  Physical Findings: AIMS: Facial and Oral Movements Muscles of Facial Expression: None, normal Lips and Perioral Area: None, normal Jaw: None, normal Tongue: None, normal,Extremity Movements Upper (arms, wrists, hands, fingers): None, normal Lower (legs, knees, ankles, toes): None, normal, Trunk Movements Neck, shoulders, hips: None, normal, Overall Severity Severity of abnormal movements (highest score from questions above): None, normal Incapacitation due to abnormal movements: None, normal Patient's awareness of abnormal movements (rate only patient's report): No Awareness, Dental Status Current problems with teeth and/or dentures?: No Does patient usually wear dentures?: No   CIWA:    COWS:     Musculoskeletal: Strength & Muscle Tone: within normal limits Gait & Station: normal Patient leans: N/A  Psychiatric Specialty Exam: Physical Exam  Nursing note and vitals reviewed.   Review of Systems  Constitutional: Negative for chills and fever.  Respiratory: Negative for cough.   Cardiovascular: Negative for chest pain.  Gastrointestinal: Negative for abdominal pain, heartburn, nausea and vomiting.  Psychiatric/Behavioral: Negative for depression, hallucinations, substance abuse and suicidal ideas.    Blood pressure 104/73, pulse 98, temperature 97.8 F (36.6 C), temperature source Oral, resp. rate 16, height 6\' 2"  (1.88 m), weight 78 kg (172 lb), SpO2 100 %.Body mass index is 22.08 kg/m.  General Appearance: Casual and Fairly Groomed  Eye Contact:  Good  Speech:  Clear and Coherent and Normal Rate  Volume:  Normal  Mood:  Euthymic  Affect:  Appropriate, Congruent and Full Range  Thought Process:  Coherent and Goal Directed  Orientation:  Full (Time, Place, and Person)  Thought Content:  Logical  Suicidal Thoughts:  No  Homicidal Thoughts:  No  Memory:  Immediate;   Fair Recent;   Fair Remote;   Fair  Judgement:  Fair  Insight:  Fair  Psychomotor Activity:  Normal  Concentration:  Concentration: Fair  Recall:  [employer] of Knowledge:  Fair  Language:  Fair  Akathisia:  No  Handed:    AIMS (if indicated):     Assets:  Communication Skills Resilience Social Support  ADL's:  Intact  Cognition:  WNL  Sleep:  Number of Hours: 6.5   Treatment Plan Summary: Daily contact with patient to assess and evaluate symptoms and progress in treatment and Medication management. Pt reports improvement of his presenting mood and psychotic symptoms. He is tolerating his medications well but notes some fatigue with daytime risperdal, so he agrees to changing it only at bedtime. He is in agreement to go to [facility] next week for substance use treatment.  -  Continue inpatient hospitalization  -Schizoaffective disorder, bipolar type             - Continue depakote DR 500mg  po BID             - Change Risperdal 1mg  po BID to risperdal 2mg  po qhs  - Anxiety             -Continue atarax 25mg  po TID prn anxiety  - Insomnia             - Continue trazodone 50mg  po qhs prn insomnia  - Encourage participation in groups and the therapeutic milieu  -Discharge planning will be ongoing   Micheal Likens, MD 12/25/2017, 1:07 PM

## 2017-12-25 NOTE — Progress Notes (Signed)
D:Pt reports that he sees and hears his "prince". He also senses and sees auras. Pt slept much of the morning. He c/o sore throat and congestion.  A:Offered support, encouragement, and 15 minute checks. Gave prn medications for physical complaints.  R:Pt denies si and hi. Safety maintained on the unit.

## 2017-12-25 NOTE — Progress Notes (Signed)
Recreation Therapy Notes  Date: 12/25/17 Time: 1000 Location: 500 Hall Dayroom  Group Topic: Wellness  Goal Area(s) Addresses:  Patient will define components of whole wellness. Patient will verbalize benefit of whole wellness.  Intervention:  None  Activity: Chair Exercises.  LRT will lead patients through a series of chair exercises and stretches.  Education: Wellness, Building control surveyorDischarge Planning.   Education Outcome: Acknowledges education/In group clarification offered/Needs additional education.   Clinical Observations/Feedback: Pt did not attend group.    Caroll RancherMarjette Qusay Villada, LRT/CTRS         Caroll RancherLindsay, Sherrie Marsan A 12/25/2017 12:34 PM

## 2017-12-26 DIAGNOSIS — F1010 Alcohol abuse, uncomplicated: Secondary | ICD-10-CM

## 2017-12-26 DIAGNOSIS — F1210 Cannabis abuse, uncomplicated: Secondary | ICD-10-CM

## 2017-12-26 DIAGNOSIS — F1410 Cocaine abuse, uncomplicated: Secondary | ICD-10-CM

## 2017-12-26 NOTE — Progress Notes (Signed)
Adult Psychoeducational Group Note  Date:  12/26/2017 Time:  8:42 PM  Group Topic/Focus:  Wrap-Up Group:   The focus of this group is to help patients review their daily goal of treatment and discuss progress on daily workbooks.  Participation Level:  Active  Participation Quality:  Appropriate  Affect:  Appropriate  Cognitive:  Appropriate  Insight: Appropriate  Engagement in Group:  Engaged  Modes of Intervention:  Discussion  Additional Comments: The patient expressed that he rates today a 9,The patient also said that he got clarity with Hope group.  Octavio Mannshigpen, Younis Mathey Lee 12/26/2017, 8:42 PM

## 2017-12-26 NOTE — Progress Notes (Signed)
D:Pt is pleasant on the unit interacting and participating in activities. Pt was playing basketball and collided with a peer on the unit. Pt assessed with no complaints. Pt rates depression as a 3 and anxiety as a 0 on 0-10 scale with 10 being the most. A:Offered support, encouragement and 15 minute checks.  R:Pt denies si and hi. Safety maintained on the unit.

## 2017-12-26 NOTE — Progress Notes (Signed)
Pt in dayroom coloring intently.  Pt sts he still hears voices but they are not a dominant as before.  Pt sts he will contact staff if voices become too much for him to control.  Pt denies SI and contracts for safety. Pt is med compliant.  Support and encouragement offered. Pt remains safe on unit

## 2017-12-26 NOTE — Progress Notes (Signed)
[facility]  12/26/2017 3:19 PM Clayton Vaughan Vaughan  MRN:  [id number]  Subjective: Clayton Vaughan reports, "I came here because of homicidal thoughts & anger issues. I'm feeling a lot better with the medicines that starts with R & the one with D. These two medicines are helping. I feel good. My anger is suppressed. I'm now able to laugh & play, do things. It feels good. I'm sleeping well, my appetite is good. No side effects".   Objective: Clayton Vaughan is a 27 y/o M with history of treatment for ADHD as an adolescent and history of polysubstance abuse who was admitted voluntarily with worsening symptoms of depression, SI without plan, HI, AH, VH, and worsening illicit substance use. Pt had relapse of use of alcohol, cannabis, cocaine, and prescription opiate pills in the context of recent homelessness when he was asked to leave the [facility] environment [facility]. Pt agreed to trial of depakote and risperdal, and he agreed to meet with SW team about substance use treatment options.  Today upon evaluation, pt shares, "I'm actually feeling pretty good today." He shares that his mood has improved as well as his outlook on his future, now that he has spoken with SW team [facility]. Pt shares that [facility], and he is looking forward to substance use treatment. He reports AH have resolved as of last night, and he denies SI/HI/VH. He is sleeping well. His appetite is good. He notes that he felt slightly drowsy after AM dose of risperdal, and he is in agreement to change risperdal to be given only at bedtime. Pt was in agreement with the above plan, and he had no further questions, comments, or concerns.   Principal Problem: Schizoaffective disorder, bipolar type (HCC) Diagnosis:   Patient Active Problem List   Diagnosis Date Noted  . Schizoaffective disorder, bipolar type (HCC) [F25.0] 12/24/2017  . Psychotic disorder (HCC) [F29]  12/23/2017  . Auditory hallucinations [R44.0] 09/05/2017  . Polysubstance abuse (HCC) [F19.10] 09/04/2017  . Substance induced mood disorder (HCC) [F19.94] 09/04/2017  . Cocaine abuse (HCC) [F14.10] 06/26/2017   Total Time spent with patient: 30 minutes  Past Psychiatric History: see H&P  Past Medical History:  Past Medical History:  Diagnosis Date  . Auditory hallucinations   . Cocaine abuse (HCC)   . Polysubstance abuse (HCC)   . Sickle cell trait (HCC)   . Sickle cell trait (HCC)   . Substance induced mood disorder (HCC)    History reviewed. No pertinent surgical history. Family History: History reviewed. No pertinent family history. Family Psychiatric  History: see H&P Social History:  Social History   Substance and Sexual Activity  Alcohol Use Yes     Social History   Substance and Sexual Activity  Drug Use Yes  . Types: Marijuana, Cocaine    Social History   Socioeconomic History  . Marital status: Single    Spouse name: None  . Number of children: None  . Years of education: None  . Highest education level: None  Social Needs  . Financial resource strain: None  . Food insecurity - worry: None  . Food insecurity - inability: None  . Transportation needs - medical: None  . Transportation needs - non-medical: None  Occupational History  . None  Tobacco Use  . Smoking status: Current Every Day Smoker    Packs/day: 1.00    Types: Cigarettes  . Smokeless tobacco: Never Used  Substance and Sexual Activity  . Alcohol use: Yes  . Drug use: Yes    Types: Marijuana, Cocaine  . Sexual activity: None  Other Topics Concern  . None  Social History Narrative  . None   Additional Social History:                         Sleep: Good  Appetite:  Good  Current Medications: Current Facility-Administered Medications  Medication Dose Route Frequency Provider Last Rate Last Dose  . acetaminophen (TYLENOL) tablet 650 mg  650 mg Oral Q6H PRN Okonkwo,  Justina A, NP   650 mg at 12/24/17 0647  . alum & mag hydroxide-simeth (MAALOX/MYLANTA) 200-200-20 MG/5ML suspension 30 mL  30 mL Oral Q4H PRN Okonkwo, Justina A, NP      . divalproex (DEPAKOTE) DR tablet 500 mg  500 mg Oral Q12H Micheal Likens, MD   500 mg at 12/26/17 0801  . hydrOXYzine (ATARAX/VISTARIL) tablet 25 mg  25 mg Oral TID PRN Ferne Reus A, NP   25 mg at 12/23/17 2154  . ibuprofen (ADVIL,MOTRIN) tablet 600 mg  600 mg Oral Q6H PRN Micheal Likens, MD   600 mg at 12/24/17 0944  . magnesium hydroxide (MILK OF MAGNESIA) suspension 30 mL  30 mL Oral Daily PRN Okonkwo, Justina A, NP      . menthol-cetylpyridinium (CEPACOL) lozenge 3 mg  1 lozenge Oral PRN Micheal Likens, MD   3 mg at 12/25/17 1121  . nicotine (NICODERM CQ - dosed in mg/24 hours) patch 21 mg  21 mg Transdermal Daily Micheal Likens, MD   21 mg at 12/26/17 0802  . pseudoephedrine (SUDAFED) 12 hr tablet 120 mg  120 mg Oral Q12H PRN Micheal Likens, MD   120 mg at 12/25/17 1120  . risperiDONE (RISPERDAL) tablet 2 mg  2 mg Oral QHS Micheal Likens, MD      . traZODone (DESYREL) tablet 50 mg  50 mg Oral QHS PRN Beryle Lathe, Justina A, NP   50 mg at 12/25/17 2104    Lab Results: No results found for this or any previous visit (from the past 48 hour(s)).  Blood Alcohol level:  Lab Results  Component Value Date   ETH 18 (H) 12/23/2017   ETH <10 09/09/2017    Metabolic Disorder Labs: No results found for: HGBA1C, MPG No results found for: PROLACTIN No results found for: CHOL, TRIG, HDL, CHOLHDL, VLDL, LDLCALC  Physical Findings: AIMS: Facial and Oral Movements Muscles of Facial Expression: None, normal Lips and Perioral Area: None, normal Jaw: None, normal Tongue: None, normal,Extremity Movements Upper (arms, wrists, hands, fingers): None, normal Lower (legs, knees, ankles, toes): None, normal, Trunk Movements Neck, shoulders, hips: None, normal, Overall  Severity Severity of abnormal movements (highest score from questions above): None, normal Incapacitation due to abnormal movements: None, normal Patient's awareness of abnormal movements (rate only patient's report): No Awareness, Dental Status Current problems with teeth and/or dentures?: No Does patient usually wear dentures?: No  CIWA:    COWS:     Musculoskeletal: Strength & Muscle Tone: within normal limits Gait & Station: normal Patient leans: N/A  Psychiatric Specialty Exam: Physical Exam  Nursing note and vitals reviewed.   Review of Systems  Constitutional: Negative for chills and fever.  Respiratory: Negative for cough.   Cardiovascular: Negative for chest pain.  Gastrointestinal: Negative for abdominal pain, heartburn, nausea and vomiting.  Psychiatric/Behavioral: Negative for depression, hallucinations, substance  abuse and suicidal ideas.    Blood pressure 118/81, pulse 86, temperature 98 F (36.7 C), temperature source Oral, resp. rate 16, height 6\' 2"  (1.88 m), weight 78 kg (172 lb), SpO2 100 %.Body mass index is 22.08 kg/m.  General Appearance: Casual and Fairly Groomed  Eye Contact:  Good  Speech:  Clear and Coherent and Normal Rate  Volume:  Normal  Mood:  Euthymic  Affect:  Appropriate, Congruent and Full Range  Thought Process:  Coherent and Goal Directed  Orientation:  Full (Time, Place, and Person)  Thought Content:  Logical  Suicidal Thoughts:  No  Homicidal Thoughts:  No  Memory:  Immediate;   Fair Recent;   Fair Remote;   Fair  Judgement:  Fair  Insight:  Fair  Psychomotor Activity:  Normal  Concentration:  Concentration: Fair  Recall:  [employer] of Knowledge:  Fair  Language:  Fair  Akathisia:  No  Handed:    AIMS (if indicated):     Assets:  Communication Skills Resilience Social Support  ADL's:  Intact  Cognition:  WNL  Sleep:  Number of Hours: 5.75   Treatment Plan Summary: Daily contact with patient to assess and evaluate  symptoms and progress in treatment and Medication management. Pt reports improvement of his presenting mood and psychotic symptoms. He is tolerating his medications well but notes some fatigue with daytime risperdal, so he agrees to changing it only at bedtime. He is in agreement to go to [facility] next week for substance use treatment.  - Continue inpatient hospitalization  -Schizoaffective disorder, bipolar type             - Continue depakote DR 500mg  po BID             - Obtain  Depakote level on Monday 12-29-17 am             - Continue Risperdal 2mg  po qhs  - Anxiety             -Continue atarax 25mg  po TID prn anxiety  - Insomnia             - Continue trazodone 50mg  po qhs prn insomnia  - Encourage participation in groups and the therapeutic milieu  -Discharge planning will be ongoing: Has been accepted at the [facility] residential for Tuesday 12-30-17.   Armandina Stammer, NP, PMHNP, FNP-BC 12/26/2017, 3:19 PMPatient ID: Clayton Bandy, male   DOB: [birth year], 27 y.o.   MRN: [id number]

## 2017-12-26 NOTE — Progress Notes (Signed)
Recreation Therapy Notes   12/26/17  1310:  Pt came and spoke with LRT about his anger management worksheet.  Pt showed LRT were he was able to show were he ranked the stages he goes through before he blows his top with anger.  Pt was encouraged with his progress but still need to identify his coping skills.  LRT will follow up with pt later.    Caroll RancherMarjette Redmond Whittley, LRT/CTRS    Lillia AbedLindsay, Emory Leaver A 12/26/2017 1:39 PM

## 2017-12-26 NOTE — Progress Notes (Signed)
Recreation Therapy Notes  Date: 12/26/17 Time: 1000 Location: 500 Hall  Group Topic: Team Building   Goal Area(s) Addresses:  Patient will effectively work together in group.  Patient will verbalize benefit of healthy team building. Patient will verbalize positive effect of healthy team building post d/c.  Patient will identify team building techniques that made activity effective for group.   Behavioral Response: Engaged  Intervention: Veterinary surgeonubber Discs   Activity: [place]ark in [place].  Each patient was given a rubber disc.  The group as a whole was given one extra disc.  Patients were to work together to get from one end of the hall to the other and then back to their starting point.  Education:  Team Building, Discharge Planning  Education Outcome: Acknowledges understanding/In group clarification offered/Needs additional education.   Clinical Observations/Feedback:  Pt was engaged and worked well with peers.  Pt stated the group used teamwork, communication, patience and balance to complete the activity.  Pt stated he could use communication with his support system so they can "know what is going on and if their plan doesn't work, have a back up plan".     Caroll RancherMarjette Kainan Patty, LRT/CTRS     Caroll RancherLindsay, Indi Willhite A 12/26/2017 12:30 PM

## 2017-12-27 DIAGNOSIS — R45851 Suicidal ideations: Secondary | ICD-10-CM

## 2017-12-27 DIAGNOSIS — G4700 Insomnia, unspecified: Secondary | ICD-10-CM

## 2017-12-27 DIAGNOSIS — F1099 Alcohol use, unspecified with unspecified alcohol-induced disorder: Secondary | ICD-10-CM

## 2017-12-27 DIAGNOSIS — F1490 Cocaine use, unspecified, uncomplicated: Secondary | ICD-10-CM

## 2017-12-27 DIAGNOSIS — F909 Attention-deficit hyperactivity disorder, unspecified type: Secondary | ICD-10-CM

## 2017-12-27 DIAGNOSIS — F250 Schizoaffective disorder, bipolar type: Principal | ICD-10-CM

## 2017-12-27 DIAGNOSIS — F1290 Cannabis use, unspecified, uncomplicated: Secondary | ICD-10-CM

## 2017-12-27 DIAGNOSIS — F329 Major depressive disorder, single episode, unspecified: Secondary | ICD-10-CM

## 2017-12-27 DIAGNOSIS — Z590 Homelessness: Secondary | ICD-10-CM

## 2017-12-27 DIAGNOSIS — F419 Anxiety disorder, unspecified: Secondary | ICD-10-CM

## 2017-12-27 DIAGNOSIS — F1190 Opioid use, unspecified, uncomplicated: Secondary | ICD-10-CM

## 2017-12-27 DIAGNOSIS — Z720 Tobacco use: Secondary | ICD-10-CM

## 2017-12-27 DIAGNOSIS — R45850 Homicidal ideations: Secondary | ICD-10-CM
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
## 2017-12-27 NOTE — Progress Notes (Signed)
[facility] MD Progress Note  12/27/2017 10:36 AM Clayton Vaughan  MRN:  [id number]  Subjective: "I am okay and feeling somewhat better and compliant with my medication without adverse effects and has no current episodes of anger suicidal/homicidal ideation, contract for safety while in the hospital."    Objective: Clayton Vaughan is a 27 y/o M with history of treatment for ADHD as an adolescent and history of polysubstance abuse who was admitted voluntarily with worsening symptoms of depression, SI without plan, HI, AH, VH, and worsening illicit substance use. Pt had relapse of use of alcohol, cannabis, cocaine, and prescription opiate pills in the context of recent homelessness when he was asked to leave the sober living environment at [facility]. Pt agreed to trial of depakote and risperdal, and he agreed to meet with SW team about substance use treatment options.  Today upon evaluation, patient was evaluated this morning and reportedly compliant with treatment program, attending group sessions and compliant with his medication and has reportedly no adverse effects of the medication.  Patient reported his mood has been improved and denied current suicidal, homicidal ideation, intention or plans.  Patient also denied auditory/visual hallucinations, delusions and paranoia.  Patient reported that he has been sleeping well with his sleeping medication trazodone and also taking other medication which is helping to control his anger and homicidal ideation which he came in to the hospital.  Patient reported his depression is very little and anxiety is also very little at this time.  Patient stated he does not have an outpatient doctors or therapist but he does not endorses relapse and drug of abuse about 2 days ago.  She has been in contact with his brother who is satisfied that he is receiving medication management and treatment that he deserves.  Patient has been spoken with social work team about treatment at  [facility] for substance abuse recovery services.  [facility].  Patient denied any disturbance of appetite or sleep.  Patient has no adverse effect of the medication including dystonias, akathisia or extrapyramidal symptoms.  Principal Problem: Schizoaffective disorder, bipolar type (HCC) Diagnosis:   Patient Active Problem List   Diagnosis Date Noted  . Schizoaffective disorder, bipolar type (HCC) [F25.0] 12/24/2017  . Psychotic disorder (HCC) [F29] 12/23/2017  . Auditory hallucinations [R44.0] 09/05/2017  . Polysubstance abuse (HCC) [F19.10] 09/04/2017  . Substance induced mood disorder (HCC) [F19.94] 09/04/2017  . Cocaine abuse (HCC) [F14.10] 06/26/2017   Total Time spent with patient: 30 minutes  Past Psychiatric History: see H&P  Past Medical History:  Past Medical History:  Diagnosis Date  . Auditory hallucinations   . Cocaine abuse (HCC)   . Polysubstance abuse (HCC)   . Sickle cell trait (HCC)   . Sickle cell trait (HCC)   . Substance induced mood disorder (HCC)    History reviewed. No pertinent surgical history. Family History: History reviewed. No pertinent family history. Family Psychiatric  History: see H&P Social History:  Social History   Substance and Sexual Activity  Alcohol Use Yes     Social History   Substance and Sexual Activity  Drug Use Yes  . Types: Marijuana, Cocaine    Social History   Socioeconomic History  . Marital status: Single    Spouse name: None  . Number of children: None  . Years of education: None  . Highest education level: None  Social Needs  . Financial resource strain: None  . Food insecurity -  worry: None  . Food insecurity - inability: None  . Transportation needs - medical: None  . Transportation needs - non-medical: None  Occupational History  . None  Tobacco Use  . Smoking status: Current Every Day Smoker    Packs/day: 1.00    Types: Cigarettes  . Smokeless tobacco: Never  Used  Substance and Sexual Activity  . Alcohol use: Yes  . Drug use: Yes    Types: Marijuana, Cocaine  . Sexual activity: None  Other Topics Concern  . None  Social History Narrative  . None   Additional Social History:                         Sleep: Good  Appetite:  Good  Current Medications: Current Facility-Administered Medications  Medication Dose Route Frequency Provider Last Rate Last Dose  . acetaminophen (TYLENOL) tablet 650 mg  650 mg Oral Q6H PRN Okonkwo, Justina A, NP   650 mg at 12/24/17 0647  . alum & mag hydroxide-simeth (MAALOX/MYLANTA) 200-200-20 MG/5ML suspension 30 mL  30 mL Oral Q4H PRN Okonkwo, Justina A, NP      . divalproex (DEPAKOTE) DR tablet 500 mg  500 mg Oral Q12H Micheal Likens, MD   500 mg at 12/27/17 0729  . hydrOXYzine (ATARAX/VISTARIL) tablet 25 mg  25 mg Oral TID PRN Ferne Reus A, NP   25 mg at 12/23/17 2154  . ibuprofen (ADVIL,MOTRIN) tablet 600 mg  600 mg Oral Q6H PRN Micheal Likens, MD   600 mg at 12/24/17 0944  . magnesium hydroxide (MILK OF MAGNESIA) suspension 30 mL  30 mL Oral Daily PRN Okonkwo, Justina A, NP      . menthol-cetylpyridinium (CEPACOL) lozenge 3 mg  1 lozenge Oral PRN Micheal Likens, MD   3 mg at 12/25/17 1121  . nicotine (NICODERM CQ - dosed in mg/24 hours) patch 21 mg  21 mg Transdermal Daily Micheal Likens, MD   21 mg at 12/27/17 [id number]  . pseudoephedrine (SUDAFED) 12 hr tablet 120 mg  120 mg Oral Q12H PRN Micheal Likens, MD   120 mg at 12/27/17 0730  . risperiDONE (RISPERDAL) tablet 2 mg  2 mg Oral QHS Micheal Likens, MD   2 mg at 12/26/17 2117  . traZODone (DESYREL) tablet 50 mg  50 mg Oral QHS PRN Beryle Lathe, Justina A, NP   50 mg at 12/26/17 2117    Lab Results: No results found for this or any previous visit (from the past 48 hour(s)).  Blood Alcohol level:  Lab Results  Component Value Date   ETH 18 (H) 12/23/2017   ETH <10 09/09/2017     Metabolic Disorder Labs: No results found for: HGBA1C, MPG No results found for: PROLACTIN No results found for: CHOL, TRIG, HDL, CHOLHDL, VLDL, LDLCALC  Physical Findings: AIMS: Facial and Oral Movements Muscles of Facial Expression: None, normal Lips and Perioral Area: None, normal Jaw: None, normal Tongue: None, normal,Extremity Movements Upper (arms, wrists, hands, fingers): None, normal Lower (legs, knees, ankles, toes): None, normal, Trunk Movements Neck, shoulders, hips: None, normal, Overall Severity Severity of abnormal movements (highest score from questions above): None, normal Incapacitation due to abnormal movements: None, normal Patient's awareness of abnormal movements (rate only patient's report): No Awareness, Dental Status Current problems with teeth and/or dentures?: No Does patient usually wear dentures?: No  CIWA:    COWS:     Musculoskeletal: Strength & Muscle Tone: within normal limits  Gait & Station: normal Patient leans: N/A  Psychiatric Specialty Exam: Physical Exam  Nursing note and vitals reviewed.   Review of Systems  Constitutional: Negative for chills and fever.  Respiratory: Negative for cough.   Cardiovascular: Negative for chest pain.  Gastrointestinal: Negative for abdominal pain, heartburn, nausea and vomiting.  Psychiatric/Behavioral: Negative for depression, hallucinations, substance abuse and suicidal ideas.    Blood pressure 118/81, pulse 86, temperature 98 F (36.7 C), temperature source Oral, resp. rate 16, height 6\' 2"  (1.88 m), weight 78 kg (172 lb), SpO2 100 %.Body mass index is 22.08 kg/m.  General Appearance: Casual and Fairly Groomed  Eye Contact:  Good  Speech:  Clear and Coherent and Normal Rate  Volume:  Normal  Mood:  Anxious and Depressed  Affect:  Appropriate, Congruent and Full Range  Thought Process:  Coherent and Goal Directed  Orientation:  Full (Time, Place, and Person)  Thought Content:  Logical   Suicidal Thoughts:  No  Homicidal Thoughts:  No  Memory:  Immediate;   Fair Recent;   Fair Remote;   Fair  Judgement:  Fair  Insight:  Fair  Psychomotor Activity:  Normal  Concentration:  Concentration: Fair  Recall:  [employer]Fair  Fund of Knowledge:  Fair  Language:  Fair  Akathisia:  No  Handed:    AIMS (if indicated):     Assets:  Communication Skills Resilience Social Support  ADL's:  Intact  Cognition:  WNL  Sleep:  Number of Hours: 6.5   Treatment Plan Summary: will continue his current treatment plan and medication management. Daily contact with patient to assess and evaluate symptoms and progress in treatment and Medication management.   Pt reports improvement of his presenting mood and psychotic symptoms. He is tolerating his medications well but notes some fatigue with daytime risperdal, so he agrees to changing it only at bedtime. [facility] is [facility].  - Continue inpatient hospitalization  -Reviewed labs, will check prolactin level, lipid panel and hemoglobin A1c tomorrow morning to monitor metabolic side effect of the antipsychotic medication  -Schizoaffective disorder, bipolar type             - Continue depakote DR 500mg  po BID             - Obtain  Depakote level on Monday 12-29-17 am             - Continue Risperdal 2mg  po qhs  - Anxiety             -Continue atarax 25mg  po TID prn anxiety  - Insomnia             - Continue trazodone 50mg  po qhs prn insomnia  - Encourage participation in groups and the therapeutic milieu  -Discharge planning will be ongoing: [facility].   Leata MouseJonnalagadda Gabbrielle Mcnicholas, MD, 12/27/2017, 10:36 AM
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
## 2017-12-27 NOTE — Progress Notes (Signed)
D- Affect - appropriate.  Mood - depressed.  Pt rates depression as a 3 and anxiety as a 0 on 0-10 scale with 10 being the most.  Behavior - appropriate with encouragement, direction and support.  Interacts appropriately with peers and staff.  Pt verbaizes that he participates in goals, groups, counselor lead group and recreation but stays back at times due to feeling sleepy.     A- Medications per MD order.  Support given throughout the day,  1:1 spent time with patient.  R- Following treatment plan.  Denies SI/HI/AH and VH.  Contracts for safety.  15 minute checks continued

## 2017-12-28 LAB — LIPID PANEL
CHOL/HDL RATIO: 2.6 ratio
Cholesterol: 156 mg/dL (ref 0–200)
HDL: 61 mg/dL (ref >40)
LDL Cholesterol: 74 mg/dL (ref 0–99)
Triglycerides: 106 mg/dL (ref <150)
VLDL: 21 mg/dL (ref 0–40)

## 2017-12-28 LAB — HEMOGLOBIN A1C
HEMOGLOBIN A1C: 5.8 % — AB (ref 4.8–5.6)
MEAN PLASMA GLUCOSE: 119.76 mg/dL

## 2017-12-28 MED ORDER — TRAZODONE HCL 50 MG PO TABS
50.0000 mg | ORAL_TABLET | Freq: Every evening | ORAL | Status: DC | PRN
Start: 2017-12-28 — End: 2017-12-31
  Administered 2017-12-29 – 2017-12-30 (×4): 50 mg via ORAL
  Filled 2017-12-28 (×2): qty 1
  Filled 2017-12-28: qty 28
  Filled 2017-12-28 (×2): qty 1

## 2017-12-28 NOTE — [facility] Group Notes (Signed)
[facility] Group Notes: (Clinical Social Work)   12/28/2017      Type of Therapy:  Group Therapy   Participation Level:  Did Not Attend despite MHT prompting   Ambrose MantleMareida Grossman-Orr, LCSW 12/28/2017, 1:25 PM

## 2017-12-28 NOTE — Progress Notes (Signed)
D: this writer approached the Pt who was overheard talking about issues of anger and how he handles it. Pt reports that he does a poor job of handling his anger and wants to learn better healthier ways to deal with it, especially with his mothers baby.  A) Sat with Pt and did several anger exercises with him in helping him to understand that anger is a second emotion and how to recognize the first emotion. R) Pt was able to understand and identify that respect is a big issue for him. He feels that when he gets angry it is due to feelings of disrespect. Pt was able to understand the ideas presented and stated that he would try to continue to understand his first and second emotions.
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
## 2017-12-28 NOTE — [facility] Group Notes (Signed)
.  Psychoeducational Group Note    Date: 12/28/2017 Time: 1600    Progressive Relaxation  Purpose of Group: Pt will learn deep breathing along with imagery and progressive relaxation techniques that can be used to help with anxeity  Participation Level:  Active  Participation Quality:  Appropriate  Affect:  Appropriate  Cognitive:  Alert  Insight:  Improving  Engagement in Group:  Engaged  Additional Comments:  Pt was able to learn effective deep breathing  Dione HousekeeperJudge, Eliyas Suddreth A

## 2017-12-28 NOTE — [facility] Group Notes (Signed)
Relaxation  Date:  12/28/2017  Time:  5:14 PM  Type of Therapy:  Nurse Education  The group focuses on teaching patientshow to utilize deep breathing to facilitate and maintain a state of relaxation.  Participation Level:  Active  Participation Quality:  Attentive  Affect:  Appropriate  Cognitive:  Appropriate  Insight:  Good  Engagement in Group:  Engaged  Modes of Intervention:  Education  Summary of Progress/Problems:  Clayton Vaughan, Clayton Vaughan 12/28/2017, 5:14 PM

## 2017-12-28 NOTE — Progress Notes (Signed)
D.   Pt pleasant on approach, denies complaints at this time.  Pt was observed in dayroom interacting appropriately with peers on the unit.  Pt is positive for evening wrap up group.  Pt denies SI/HI at this time but does still report some auditory hallucinations of a command nature at times.  Pt contracts for safety on the unit.  A.  Support and encouragement offered, medication given as ordered  R.  Pt remains safe on the unit, will continue to monitor.

## 2017-12-28 NOTE — [facility] Group Notes (Signed)
.  Psychoeducational Group Note    Date: 12/28/2017 Time: 1300    Life Skills  Purpose of Group: . The group focus' on teaching patients on how to identify their needs and how to develop the coping skills needed to get their needs met  Participation Level:  Minimal  Participation Quality:  Inattentive  Affect:  Flat  Cognitive:  Alert  Insight:  Improving  Engagement in Group:  Poor  Additional Comments:  Pt was able to attend but just sat and didn't engage so much.   Paulino Rily

## 2017-12-29 DIAGNOSIS — Z79899 Other long term (current) drug therapy: Secondary | ICD-10-CM

## 2017-12-29 LAB — PROLACTIN: Prolactin: 45.7 ng/mL — ABNORMAL HIGH (ref 4.0–15.2)

## 2017-12-29 LAB — VALPROIC ACID LEVEL: VALPROIC ACID LVL: 67 ug/mL (ref 50.0–100.0)

## 2017-12-29 MED ORDER — RISPERIDONE 3 MG PO TABS
3.0000 mg | ORAL_TABLET | Freq: Every day | ORAL | Status: DC
  Administered 2017-12-29 – 2017-12-30 (×2): 3 mg via ORAL
  Filled 2017-12-29: qty 3
  Filled 2017-12-29: qty 7
  Filled 2017-12-29 (×2): qty 3

## 2017-12-29 NOTE — Plan of Care (Signed)
Patient has not had any episodes of anger, acting out.  Patient has not engaged in any self harm. Denies thoughts to do so.

## 2017-12-29 NOTE — Progress Notes (Signed)
D: Patient observed resting in bed this AM. Reports he slept well. Chart indicates 5 hours. Patient states he has no concerns. Denies SI/HI however continues to endorse AVH - "I see spirits and hear them, especially at night. I see orbs and auras." Patient's affect flat, mood neutral, pleasant. Per self inventory and discussions with writer, rates depression at a 2/10 and anxiety at a 4/10. Rates sleep as good, appetite as good, energy as normal and concentration as good.  States goal for today is "not sure." Denies pain, physical complaints.   A: Medicated per orders, no prns requested or required. Level III obs in place for safety. Emotional support offered and self inventory reviewed. Encouraged completion of Suicide Safety Plan and programming participation. Discussed POC with MD, SW.    R: Patient verbalizes understanding of POC. Patient remains safe on level III obs. Will continue to monitor closely and make verbal contact frequently.

## 2017-12-29 NOTE — [facility] Group Notes (Signed)
[facility] Group Notes:  Nursing  Date:  12/29/2017  Time: 2:00 pm  Type of Therapy: Meditation / Dealing with Anxiety  Participation Level:  Active  Participation Quality:  Appropriate and Attentive  Affect:  Appropriate  Cognitive:  Alert and Appropriate  Insight:  Appropriate  Engagement in Group:  Supportive  Modes of Intervention:  Support  Summary of Progress/Problems: Patient participated in the exercise and gave appropriate feedback at end of session.  Cranford MonBeaudry, Katiria Calame Evans 12/29/2017, 4:18 PM

## 2017-12-29 NOTE — Progress Notes (Signed)
Pt did not attend wrap-up group   

## 2017-12-29 NOTE — Progress Notes (Signed)
Recreation Therapy Notes  Date: 12/29/17 Time: 0930 Location: 300 Hall Dayroom  Group Topic: Stress Management  Goal Area(s) Addresses:  Patient will verbalize importance of using healthy stress management.  Patient will identify positive emotions associated with healthy stress management.   Intervention: Stress Management  Activity :  Meditation.  LRT introduced the stress management technique of meditation.  LRT played Vaughan meditation that allowed patients to focus on finding forgiveness for people who may have wronged them.  Education:  Stress Management, Discharge Planning.   Education Outcome: Acknowledges edcuation/In group clarification offered/Needs additional education  Clinical Observations/Feedback: Pt did not attend group.    Clayton Vaughan, LRT/CTRS         Clayton Vaughan 12/29/2017 10:51 AM 

## 2017-12-29 NOTE — Tx Team (Signed)
Interdisciplinary Treatment and Diagnostic Plan Update  12/29/2017 Time of Session: 8:10 AM  Clayton Vaughan MRN: [id number]  Principal Diagnosis: Schizoaffective disorder, bipolar type [facility])  Secondary Diagnoses: Principal Problem:   Schizoaffective disorder, bipolar type (HCC)   Current Medications:  Current Facility-Administered Medications  Medication Dose Route Frequency Provider Last Rate Last Dose  . acetaminophen (TYLENOL) tablet 650 mg  650 mg Oral Q6H PRN Okonkwo, Justina A, NP   650 mg at 12/24/17 0647  . alum & mag hydroxide-simeth (MAALOX/MYLANTA) 200-200-20 MG/5ML suspension 30 mL  30 mL Oral Q4H PRN Okonkwo, Justina A, NP      . divalproex (DEPAKOTE) DR tablet 500 mg  500 mg Oral Q12H Micheal Likens, MD   500 mg at 12/29/17 0756  . hydrOXYzine (ATARAX/VISTARIL) tablet 25 mg  25 mg Oral TID PRN Ferne Reus A, NP   25 mg at 12/27/17 2056  . ibuprofen (ADVIL,MOTRIN) tablet 600 mg  600 mg Oral Q6H PRN Micheal Likens, MD   600 mg at 12/24/17 0944  . magnesium hydroxide (MILK OF MAGNESIA) suspension 30 mL  30 mL Oral Daily PRN Okonkwo, Justina A, NP      . menthol-cetylpyridinium (CEPACOL) lozenge 3 mg  1 lozenge Oral PRN Micheal Likens, MD   3 mg at 12/25/17 1121  . nicotine (NICODERM CQ - dosed in mg/24 hours) patch 21 mg  21 mg Transdermal Daily Micheal Likens, MD   21 mg at 12/28/17 0820  . pseudoephedrine (SUDAFED) 12 hr tablet 120 mg  120 mg Oral Q12H PRN Micheal Likens, MD   120 mg at 12/28/17 2101  . risperiDONE (RISPERDAL) tablet 2 mg  2 mg Oral QHS Micheal Likens, MD   2 mg at 12/28/17 2101  . traZODone (DESYREL) tablet 50 mg  50 mg Oral QHS PRN,MR X 1 Nira Conn A, NP        PTA Medications: Medications Prior to Admission  Medication Sig Dispense Refill Last Dose  . cyclobenzaprine (FLEXERIL) 5 MG tablet Take 1 tablet (5 mg total) by mouth at bedtime. (Patient not taking: Reported on 12/23/2017)  7 tablet 0 Not Taking at Unknown time    Treatment Modalities: Medication Management, Group therapy, Case management,  1 to 1 session with clinician, Psychoeducation, Recreational therapy.  Patient Stressors: Financial difficulties Marital or family conflict Substance abuse  Patient Strengths: Wellsite geologist fund of knowledge Physical Health   Physician Treatment Plan for Primary Diagnosis: Schizoaffective disorder, bipolar type (HCC) Long Term Goal(s): Improvement in symptoms so as ready for discharge  Short Term Goals: Compliance with prescribed medications will improve Ability to identify triggers associated with substance abuse/mental health issues will improve  Medication Management: Evaluate patient's response, side effects, and tolerance of medication regimen.  Therapeutic Interventions: 1 to 1 sessions, Unit Group sessions and Medication administration.  Evaluation of Outcomes: Progressing  Physician Treatment Plan for Secondary Diagnosis: Principal Problem:   Schizoaffective disorder, bipolar type (HCC)  Long Term Goal(s): Improvement in symptoms so as ready for discharge  Short Term Goals: Compliance with prescribed medications will improve Ability to identify triggers associated with substance abuse/mental health issues will improve  Medication Management: Evaluate patient's response, side effects, and tolerance of medication regimen.  Therapeutic Interventions: 1 to 1 sessions, Unit Group sessions and Medication administration.  Evaluation of Outcomes: Progressing   RN Treatment Plan for Primary Diagnosis: Schizoaffective disorder, bipolar type (HCC) Long Term Goal(s): Knowledge of disease and therapeutic regimen to  maintain health will improve  Short Term Goals: Ability to verbalize frustration and anger appropriately will improve, Ability to demonstrate self-control, Ability to identify and develop effective coping behaviors will improve and  Compliance with prescribed medications will improve  Medication Management: RN will administer medications as ordered by provider, will assess and evaluate patient's response and provide education to patient for prescribed medication. RN will report any adverse and/or side effects to prescribing provider.  Therapeutic Interventions: 1 on 1 counseling sessions, Psychoeducation, Medication administration, Evaluate responses to treatment, Monitor vital signs and CBGs as ordered, Perform/monitor CIWA, COWS, AIMS and Fall Risk screenings as ordered, Perform wound care treatments as ordered.  Evaluation of Outcomes: Progressing   LCSW Treatment Plan for Primary Diagnosis: Schizoaffective disorder, bipolar type (HCC) Long Term Goal(s): Safe transition to appropriate next level of care at discharge, Engage patient in therapeutic group addressing interpersonal concerns.  Short Term Goals: Engage patient in aftercare planning with referrals and resources, Increase ability to appropriately verbalize feelings, Facilitate acceptance of mental health diagnosis and concerns, Facilitate patient progression through stages of change regarding substance use diagnoses and concerns, Identify triggers associated with mental health/substance abuse issues and Increase skills for wellness and recovery  Therapeutic Interventions: Assess for all discharge needs, 1 to 1 time with Social worker, Explore available resources and support systems, Assess for adequacy in community support network, Educate family and significant other(s) on suicide prevention, Complete Psychosocial Assessment, Interpersonal group therapy.  Evaluation of Outcomes: Progressing   Progress in Treatment: Attending groups: Yes Participating in groups: Yes Taking medication as prescribed: Yes Toleration of medication: Yes, no side effects reported at this time Family/Significant other contact made: No Patient understands diagnosis: Yes AEB asking  for help with anger  Discussing patient identified problems/goals with staff: Yes Medical problems stabilized or resolved: Yes Denies suicidal/homicidal ideation: Yes Issues/concerns per patient self-inventory: None Other: N/A  New problem(s) identified: None identified at this time.   New Short Term/Long Term Goal(s): "I need help with my anger and impulsiveness".   Discharge Plan or Barriers: Upon discharge pt will go to a shelter in [place] and [facility]   Reason for Continuation of Hospitalization: Depression Hallucinations Homicidal ideation Medication stabilization   Estimated Length of Stay: 12/30/17  Attendees: Patient: Clayton Vaughan  12/29/2017  8:10 AM  Physician: Jolyne Loahristopher Rainville, MD 12/29/2017  8:10 AM  Nursing: Estella HuskElizabeth Awofabeju, RN 12/29/2017  8:10 AM  RN Care Manager: Onnie BoerJennifer Clark, RN 12/29/2017  8:10 AM  Social Worker: Baldo DaubJolan Bradley [place], LCSWA 12/29/2017  8:10 AM  Recreational Therapist: Caroll RancherMarjette Lindsay, LRT 12/29/2017  8:10 AM  Other: Tomasita Morrowelora Sutton, P4CC 12/29/2017  8:10 AM  Other:  12/29/2017  8:10 AM  Other: 12/29/2017  8:10 AM    Scribe for Treatment Team: Maeola SarahJolan E Vauda Salvucci, LCSWA 12/29/2017 8:10 AM

## 2017-12-29 NOTE — Progress Notes (Signed)
Patient ID: Clayton Vaughan, male   DOB: [birth year], 27 y.o.   MRN: [id number]  Pt currently presents with a flat affect and depressed behavior. Pt reports ongoing anxiety about discharge tomorrow. Pt states "I feel really agitated and anxious right now." Requests as needed medications. Pt reports good sleep with current medication regimen.   Pt provided with medications per providers orders. Pt's labs and vitals were monitored throughout the night. Pt given a 1:1 about emotional and mental status. Pt supported and encouraged to express concerns and questions. Pt educated on medications, alternative stress relieving techniques and suicide prevention precautions.   Pt's safety ensured with 15 minute and environmental checks. Pt currently denies SI/HI and A/V hallucinations currently. Pt verbally agrees to seek staff if SI/HI or A/VH occurs and to consult with staff before acting on any harmful thoughts. Pt has a history of auditory hallucinations. Will continue POC.

## 2017-12-29 NOTE — Progress Notes (Signed)
Adult Psychoeducational Group Note  Date:  12/29/2017 Time:  1:11 PM  Group Topic/Focus:  Goals Group:   The focus of this group is to help patients establish daily goals to achieve during treatment and discuss how the patient can incorporate goal setting into their daily lives to aide in recovery.  Participation Level:  Active  Participation Quality:  Appropriate  Affect:  Appropriate  Cognitive:  Alert and Appropriate  Insight: Appropriate, Good and Improving  Engagement in Group:  Engaged  Modes of Intervention:  Activity and Discussion  Additional Comments:  Pt did participate in group discussion and activity today.  Makynzie Dobesh R Brittyn Salaz 12/29/2017, 1:11 PM

## 2017-12-29 NOTE — Progress Notes (Signed)
[facility] MD Progress Note  12/29/2017 3:36 PM Clayton Vaughan  MRN:  [id number]   Subjective:  Patient reports " I am feeling better". States that he feels comfortable on unit at this time. Denies medication side effects. Denies suicidal ideations and is future oriented, stating he is planning to go to a rehab program soon.  He reports chronic auditory hallucinations, states " what they say depends on how I am feeling . If I am in a good mood they say good things". He states hallucinations have decreased in frequency, describes voices as more muddled than before. Denies command hallucinations at this time. He states he also sometimes sees auras and human figures but this has also improved .   Objective:  I have discussed case with treatment team and have met with patient . 27 year old male, admitted on 2/6 for worsening depression, SI, hallucinations, substance abuse. Admission UDS positive for opiates, cocaine , cannabis.  He was transferred from 500 unit to 400 unit yesterday as had verbal interaction with another peer, which he reports was initiated by the other person. At this time he presents calm, pleasant on approach. He states he is feeling better, and minimizes depression at present. Does report persistent hallucinations ( AH, occasional VH) but decreased compared to admission. He does not appear internally preoccupied at this time, no thought disorder is noted. Denies suicidal ideations. Denies medication side effects. States he feels medications are helping, and states that he feels calmer, " not angry/irritable like I was before". Labs- Valproic Acid 67 ( therapeutic)   Principal Problem: Schizoaffective disorder, bipolar type ([place]) Diagnosis:   Patient Active Problem List   Diagnosis Date Noted  . Schizoaffective disorder, bipolar type ([place]) [F25.0] 12/24/2017  . Psychotic disorder ([place]) [F29] 12/23/2017  . Auditory hallucinations [R44.0] 09/05/2017  . Polysubstance abuse ([place])  [F19.10] 09/04/2017  . Substance induced mood disorder ([place]) [F19.94] 09/04/2017  . Cocaine abuse ([place]) [F14.10] 06/26/2017   Total Time spent with patient: 20 minutes  Past Psychiatric History: See H&P  Past Medical History:  Past Medical History:  Diagnosis Date  . Auditory hallucinations   . Cocaine abuse ([place])   . Polysubstance abuse ([place])   . Sickle cell trait ([place])   . Sickle cell trait ([place])   . Substance induced mood disorder ([place])    History reviewed. No pertinent surgical history. Family History: History reviewed. No pertinent family history. Family Psychiatric  History: See H&P Social History:  Social History   Substance and Sexual Activity  Alcohol Use Yes     Social History   Substance and Sexual Activity  Drug Use Yes  . Types: Marijuana, Cocaine    Social History   Socioeconomic History  . Marital status: Single    Spouse name: None  . Number of children: None  . Years of education: None  . Highest education level: None  Social Needs  . Financial resource strain: None  . Food insecurity - worry: None  . Food insecurity - inability: None  . Transportation needs - medical: None  . Transportation needs - non-medical: None  Occupational History  . None  Tobacco Use  . Smoking status: Current Every Day Smoker    Packs/day: 1.00    Types: Cigarettes  . Smokeless tobacco: Never Used  Substance and Sexual Activity  . Alcohol use: Yes  . Drug use: Yes    Types: Marijuana, Cocaine  . Sexual activity: None  Other Topics Concern  . None  Social History Narrative  . None   Additional Social History:   Sleep: Fair  Appetite:  Good  Current Medications: Current Facility-Administered Medications  Medication Dose Route Frequency Provider Last Rate Last Dose  . acetaminophen (TYLENOL) tablet 650 mg  650 mg Oral Q6H PRN Okonkwo, Justina A, NP   650 mg at 12/24/17 0647  . alum & mag hydroxide-simeth (MAALOX/MYLANTA) 200-200-20 MG/5ML suspension 30  mL  30 mL Oral Q4H PRN Okonkwo, Justina A, NP      . divalproex (DEPAKOTE) DR tablet 500 mg  500 mg Oral Q12H Pennelope Bracken, MD   500 mg at 12/29/17 0756  . hydrOXYzine (ATARAX/VISTARIL) tablet 25 mg  25 mg Oral TID PRN Hughie Closs A, NP   25 mg at 12/27/17 2056  . ibuprofen (ADVIL,MOTRIN) tablet 600 mg  600 mg Oral Q6H PRN Pennelope Bracken, MD   600 mg at 12/24/17 0944  . magnesium hydroxide (MILK OF MAGNESIA) suspension 30 mL  30 mL Oral Daily PRN Okonkwo, Justina A, NP      . menthol-cetylpyridinium (CEPACOL) lozenge 3 mg  1 lozenge Oral PRN Pennelope Bracken, MD   3 mg at 12/25/17 1121  . nicotine (NICODERM CQ - dosed in mg/24 hours) patch 21 mg  21 mg Transdermal Daily Pennelope Bracken, MD   21 mg at 12/29/17 1103  . pseudoephedrine (SUDAFED) 12 hr tablet 120 mg  120 mg Oral Q12H PRN Pennelope Bracken, MD   120 mg at 12/28/17 2101  . risperiDONE (RISPERDAL) tablet 2 mg  2 mg Oral QHS Pennelope Bracken, MD   2 mg at 12/28/17 2101  . traZODone (DESYREL) tablet 50 mg  50 mg Oral QHS PRN,MR X 1 Rozetta Nunnery, NP        Lab Results:  Results for orders placed or performed during the hospital encounter of 12/23/17 (from the past 48 hour(s))  Lipid panel     Status: None   Collection Time: 12/28/17  6:10 AM  Result Value Ref Range   Cholesterol 156 0 - 200 mg/dL   Triglycerides 106 <150 mg/dL   HDL 61 >40 mg/dL   Total CHOL/HDL Ratio 2.6 RATIO   VLDL 21 0 - 40 mg/dL   LDL Cholesterol 74 0 - 99 mg/dL    Comment:        Total Cholesterol/HDL:CHD Risk Coronary Heart Disease Risk Table                     Men   Women  1/2 Average Risk   3.4   3.3  Average Risk       5.0   4.4  2 X Average Risk   9.6   7.1  3 X Average Risk  23.4   11.0        Use the calculated Patient Ratio above and the CHD Risk Table to determine the patient's CHD Risk.        ATP III CLASSIFICATION (LDL):  <100     mg/dL   Optimal  100-129  mg/dL   Near or  Above                    Optimal  130-159  mg/dL   Borderline  160-189  mg/dL   High  >190     mg/dL   Very High Performed at [facility] [address] [place],  [address]   Hemoglobin A1c  Status: Abnormal   Collection Time: 12/28/17  6:10 AM  Result Value Ref Range   Hgb A1c MFr Bld 5.8 (H) 4.8 - 5.6 %    Comment: (NOTE) Pre diabetes:          5.7%-6.4% Diabetes:              >6.4% Glycemic control for   <7.0% adults with diabetes    Mean Plasma Glucose 119.76 mg/dL    Comment: Performed at [facility] [address] [place], [place] [address]  Prolactin     Status: Abnormal   Collection Time: 12/28/17  6:10 AM  Result Value Ref Range   Prolactin 45.7 (H) 4.0 - 15.2 ng/mL    Comment: (NOTE) Performed At: [facility] [place], [place] [id number] Rush Farmer MD [id number] Performed at [facility], [place] [address], [place], [place] [address]   Valproic acid level     Status: None   Collection Time: 12/29/17  6:37 AM  Result Value Ref Range   Valproic Acid Lvl 67 50.0 - 100.0 ug/mL    Comment: Performed at [facility], [place] [address], [place], [place] [address]    Blood Alcohol level:  Lab Results  Component Value Date   ETH 18 (H) 12/23/2017   ETH <10 70/35/0093    Metabolic Disorder Labs: Lab Results  Component Value Date   HGBA1C 5.8 (H) 12/28/2017   MPG 119.76 12/28/2017   Lab Results  Component Value Date   PROLACTIN 45.7 (H) 12/28/2017   Lab Results  Component Value Date   CHOL 156 12/28/2017   TRIG 106 12/28/2017   HDL 61 12/28/2017   CHOLHDL 2.6 12/28/2017   VLDL 21 12/28/2017   LDLCALC 74 12/28/2017    Physical Findings: AIMS: Facial and Oral Movements Muscles of Facial Expression: None, normal Lips and Perioral Area: None, normal Jaw: None, normal Tongue: None, normal,Extremity Movements Upper (arms, wrists, hands, fingers): None,  normal Lower (legs, knees, ankles, toes): None, normal, Trunk Movements Neck, shoulders, hips: None, normal, Overall Severity Severity of abnormal movements (highest score from questions above): None, normal Incapacitation due to abnormal movements: None, normal Patient's awareness of abnormal movements (rate only patient's report): No Awareness, Dental Status Current problems with teeth and/or dentures?: No Does patient usually wear dentures?: No  CIWA:    COWS:     Musculoskeletal: Strength & Muscle Tone: within normal limits Gait & Station: normal Patient leans: N/A  Psychiatric Specialty Exam: Physical Exam  Nursing note and vitals reviewed. Constitutional: He is oriented to person, place, and time. He appears well-developed and well-nourished.  Cardiovascular: Normal rate.  Respiratory: Effort normal.  Musculoskeletal: Normal range of motion.  Neurological: He is alert and oriented to person, place, and time.  Skin: Skin is warm.    Review of Systems  Constitutional: Negative.   HENT: Negative.   Eyes: Negative.   Respiratory: Negative.   Cardiovascular: Negative.   Gastrointestinal: Negative.   Genitourinary: Negative.   Musculoskeletal: Negative.   Skin: Negative.   Neurological: Negative.   Endo/Heme/Allergies: Negative.   Psychiatric/Behavioral: Negative.   denies headache, denies chest pain, no shortness of breath, no vomiting   Blood pressure 114/71, pulse 93, temperature (!) 97.3 F (36.3 C), temperature source Oral, resp. rate 18, height '6\' 2"'  (1.88 m), weight 78 kg (172 lb), SpO2 100 %.Body mass index is 22.08 kg/m.  General Appearance: Fairly Groomed  Eye Contact:  Good  Speech:  Normal Rate  Volume:  Normal  Mood:  Euthymic denies feeling depressed at this time, describes mood as normal  Affect:  Appropriate and smiles at times appropriately   Thought Process:  Linear and Descriptions of Associations: Intact  Orientation:  Other:  fully alert and  attentive  Thought Content:  describes auditory halluciantions and occasional visual hallucinations ( human figures, auras), which have been persistent but decreased compared to prior to admission, not currently internally preoccupied, no delusions expressed   Suicidal Thoughts:  No denies suicidal or self injurious ideations, no homicidal or violent ideations   Homicidal Thoughts:  No  Memory:  recent and remote grossly intact  Judgement:  Other:  improving   Insight:  improving   Psychomotor Activity:  Normal  Concentration:  Concentration: Good and Attention Span: Good  Recall:  Good  Fund of Knowledge:  Good  Language:  Good  Akathisia:  No  Handed:  Right  AIMS (if indicated):     Assets:  Communication Skills Desire for Improvement Financial Resources/Insurance Housing Physical Health Social Support Transportation  ADL's:  Intact  Cognition:  WNL  Sleep:  Number of Hours: 5   Assessment - patient reports he is feeling better. At this time denies depression, denies anger or irritability and behavior is in good control, pleasant on approach. Describes lingering /chronic hallucinations, but states that they are becoming more muddled and distant. Does not appear internally preoccupied or disorganized at this time. Future oriented, stating he is looking forward to going to a residential rehab program at discharge. Denies medication side effects, Valproic Acid  therapeutic .   Treatment Plan Summary: Daily contact with patient to assess and evaluate symptoms and progress in treatment, Medication management and Plan is to: -Treatment plan reviewed as below today 2/11  -Encourage group and milieu participation to work on coping skills and symptom reduction -Encourage efforts to work on sobriety , relapse prevention -Treatment team working on discharge plan as above  -Continue Depakote 500 mg PO Q12H for mood disorder  -Continue Vistaril 25 mg PO TID PRN for anxiety -Increase  Risperidone to 3  mg PO QHS for mood disorder, psychosis  -Continue Trazodone 50 mg PO QHS PRN for insomnia   Clayton Campus, MD 12/29/2017, 3:36 PM   Patient ID: Clayton Vaughan, male   DOB: 03-Jan-1991, 27 y.o.   MRN: [id number]

## 2017-12-30 MED ORDER — HYDROXYZINE HCL 25 MG PO TABS: 25.0000 mg | ORAL_TABLET | Freq: Three times a day (TID) | ORAL | 0 refills | Status: AC | PRN

## 2017-12-30 MED ORDER — TRAZODONE HCL 50 MG PO TABS
50.0000 mg | ORAL_TABLET | Freq: Every evening | ORAL | Status: DC | PRN
  Filled 2017-12-30: qty 14

## 2017-12-30 MED ORDER — ADULT MULTIVITAMIN W/MINERALS CH
1.0000 | ORAL_TABLET | Freq: Every day | ORAL | Status: DC
  Filled 2017-12-30 (×2): qty 14

## 2017-12-30 MED ORDER — TRAZODONE HCL 50 MG PO TABS: 50.0000 mg | ORAL_TABLET | Freq: Every evening | ORAL | 0 refills | Status: AC | PRN

## 2017-12-30 MED ORDER — DIVALPROEX SODIUM 500 MG PO DR TAB: 500.0000 mg | DELAYED_RELEASE_TABLET | Freq: Two times a day (BID) | ORAL | 0 refills | Status: AC

## 2017-12-30 MED ORDER — RISPERIDONE 3 MG PO TABS: 3.0000 mg | ORAL_TABLET | Freq: Every day | ORAL | 0 refills | Status: AC

## 2017-12-30 NOTE — Plan of Care (Signed)
Patient as been sleeping well at night and staying up throughout the day. He participates in all unit activities and is appropriate in his behaviors on the unit.

## 2017-12-30 NOTE — Progress Notes (Signed)
Recreation Therapy Notes  Date: 2.12.19 Time: 2:45 pm  Location: [address] Day[facility]   AAA/T Program Assumption of Risk Form signed by Patient/ or Parent Legal Guardian Yes  Patient is free of allergies or sever asthma Yes  Patient reports no fear of animals Yes  Patient reports no history of cruelty to animals Yes  Patient understands his/her participation is voluntary Yes  Patient washes hands before animal contact Yes  Patient washes hands after animal contact Yes  Behavioral Response: Engaged   Education:Hand Washing, Appropriate Animal Interaction   Education Outcome: Acknowledges education.   Clinical Observations/Feedback: Patient attended session and interacted appropriately with therapy dog and peers. Patient asked appropriate questions about therapy dog and his training. Patient shared stories about their pets at home with group.   [name], Recreation Therapy Intern   Clayton Vaughan 12/30/2017 8:42 AM 

## 2017-12-30 NOTE — Progress Notes (Signed)
  [facility] :  Will you be returning to the same living situation after discharge:  No. [facility] at discharge.  At discharge, do you have transportation home?: Yes,  [facility] Do you have the ability to pay for your medications: No.  Release of information consent forms completed and in the chart;  Patient's signature needed at discharge.  Patient to Follow up at: Follow-up Information    [facility] Follow up on 12/31/2017.   Why:  Follow up iwith them after you leave [facility].  Please bring any proof of income, ID, and your social security card.  Contact information: [address] [place] [address] [phone]        [facility], [facility] Follow up on 12/31/2017.   Why:  Wednesday at 8AM for your screening for admission appointment.  Make sure you have 2 weeks of medication and a piece of [place] [facility]. Contact information: [address] [place] [address] [phone]           Next level of care provider [facility]:yes  Safety Planning and Suicide Prevention discussed: Yes,  with the patient  Have you used any form of tobacco in the last 30 days? (Cigarettes, Smokeless Tobacco, Cigars, and/or Pipes): Yes  Has patient been referred to the Quitline?: Patient refused referral  Patient has been referred for addiction treatment: N/A  Maeola SarahJolan E Myha Arizpe, LCSWA 12/30/2017, 4:12 PM

## 2017-12-30 NOTE — BHH Suicide Risk Assessment (Deleted)
[facility] Discharge Suicide Risk Assessment   [facility]: Schizoaffective disorder, bipolar type [facility](HCC) Discharge Diagnoses: Substance Induced Psyhcotic Disorder Patient Active Problem List   Diagnosis Date Noted  . Schizoaffective disorder, bipolar type (HCC) [F25.0] 12/24/2017  . Psychotic disorder (HCC) [F29] 12/23/2017  . Auditory hallucinations [R44.0] 09/05/2017  . Polysubstance abuse (HCC) [F19.10] 09/04/2017  . Substance induced mood disorder (HCC) [F19.94] 09/04/2017  . Cocaine abuse (HCC) [F14.10] 06/26/2017    Total Time spent with patient: 45 minutes  Musculoskeletal: Strength & Muscle Tone: within normal limits Gait & Station: normal Patient leans: N/A  Psychiatric Specialty Exam: Review of Systems  Constitutional: Negative.   HENT: Negative.   Eyes: Negative.   Respiratory: Negative.   Cardiovascular: Negative.   Gastrointestinal: Negative.   Genitourinary: Negative.   Musculoskeletal: Negative.   Skin: Negative.   Neurological: Negative.   Endo/Heme/Allergies: Negative.   Psychiatric/Behavioral: Negative for depression, hallucinations, memory loss, substance abuse and suicidal ideas. The patient is not nervous/anxious and does not have insomnia.     Blood pressure 120/64, pulse 72, temperature 98.1 F (36.7 C), temperature source Oral, resp. rate 16, height 6\' 2"  (1.88 m), weight 78 kg (172 lb), SpO2 100 %.Body mass index is 22.08 kg/m.  General Appearance: Neatly dressed, pleasant, engaging well and cooperative. Appropriate behavior. Not in any distress. Good relatedness. Not internally stimulated.  Eye Contact::  Good  Speech:  Spontaneous, normal prosody. Normal tone and rate.   Volume:  Normal  Mood:  Euthymic  Affect:  Appropriate and Full Range  Thought Process:  Linear  Orientation:  Full (Time, Place, and Person)  Thought Content:  No delusional theme. No preoccupation with violent thoughts. No negative ruminations. No obsession.  No  hallucination in any modality.   Suicidal Thoughts:  No  Homicidal Thoughts:  No  Memory:  Immediate;   Good Recent;   Good Remote;   Good  Judgement:  Good  Insight:  Good  Psychomotor Activity:  Normal  Concentration:  Good  Recall:  Good  Fund of Knowledge:Good  Language: Good  Akathisia:  Negative  Handed:    AIMS (if indicated):     Assets:  Communication Skills Desire for Improvement Physical Health Resilience  Sleep:  Number of Hours: 6.75  Cognition: WNL  ADL's:  Intact   Clinical  Assessment::   27 y.o AAM, homeless, unemployed, limited support. Background history of SUD and Mood Disorder. Presented to the ER unaccompanied. Was very vague at presentation. He expressed feeling depressed and very angry. He expressed homicidal thoughts. Main stressor as being homeless and feeling not supported by his family. BAL was 18 mg/dl. UDS was positive for cocaine, opiates and THC. Patient was started on Risperidone and Depakote. Visual hallucination of spirits and rage responded to treatment.   Chart reviewed today. Patient discussed at team today. Patient reported to be doing well. [facility] Nursing staff reports that patient has been appropriate on the unit. Patient has been interacting well with peers. No behavioral issues. Patient has not voiced any suicidal thoughts. Patient has not been observed to be internally stimulated. Patient has been adherent with treatment recommendations. Patient has been tolerating their medication well.   Seen today. Patient was initially evasive. Did not want to talk about how he felt when he came here. Says he is no longer having any violent thoughts or homicidal thoughts. He just wants to get into rehab and deal with his addiction. Reports that he is in  good spirits. Not feeling depressed. Reports normal energy and interest. Has been maintaining normal biological functions. He is able to think clearly. He is able to focus on  task. His thoughts are not crowded or racing. No evidence of mania. No hallucination in any modality. He is not making any delusional statement. No passivity of will/thought. He is fully in touch with reality. No thoughts of suicide. No thoughts of homicide. No violent thoughts. No overwhelming anxiety. No access to weapons. He has been tolerating his medications well. No residual withdrawal symptoms.    Demographic Factors:  Male and Low socioeconomic status  Loss Factors: Financial problems/change in socioeconomic status  Historical Factors: Impulsivity  Risk Reduction Factors:   Positive social support, Positive therapeutic relationship and Positive coping skills or problem solving skills  Continued Clinical Symptoms:  As above   Cognitive Features That Contribute To Risk:  None    Suicide Risk:  Minimal: No identifiable suicidal ideation.  Patient is not having any thoughts of suicide at this time. Modifiable risk factors targeted during this admission includes Mood disorder and substance use. Demographical and historical risk factors cannot be modified. Patient is now engaging well. Patient is reliable and is future oriented. We have buffered patient's support structures. At this point, patient is at low risk of suicide. Patient is aware of the effects of psychoactive substances on decision making process. Patient has been provided with emergency contacts. Patient acknowledges to use resources provided if unforseen circumstances changes their current risk stratification.    Follow-up Information    [facility] Follow up on 12/31/2017.   Why:  Follow up iwith them after you leave [facility].  Please bring any proof of income, ID, and your social security card.  Contact information: [address] [place] [address] [phone]        [facility] Follow up on 12/31/2017.   Why:  Wednesday at 8AM for your screening for admission appointment.  Make sure you have 2 weeks  of medication and a piece of mail [place] [facility] [place]. Contact information: [address] [place] [address] [phone]           Plan Of Care/Follow-up recommendations:  1. Continue current psychotropic medications 2. Mental health and addiction follow up as arranged.  3. Provided limited quantity of prescriptions   Charlott HollerSpeagle, Caylee Vlachos Church, RN 12/30/2017, 8:39 PM

## 2017-12-30 NOTE — BHH Group Notes (Signed)
Adult Psychoeducational Group Note  Date:  12/30/2017 Time:  8:47 AM  Group Topic/Focus:  Goals Group:   The focus of this group is to help patients establish daily goals to achieve during treatment and discuss how the patient can incorporate goal setting into their daily lives to aide in recovery.  Participation Level:  Active  Participation Quality:  Appropriate  Affect:  Appropriate  Cognitive:  Alert  Insight: Good  Engagement in Group:  Engaged  Modes of Intervention:  Orientation  Additional Comments:  Pt was alert and engaged in group. Pt goal for today is to be patient and focus.  [name] 12/30/2017, 8:47 AM

## 2017-12-30 NOTE — B[facility] Group Notes (Signed)
Pt was invited but did not attend group. 

## 2017-12-30 NOTE — Progress Notes (Signed)
Data. Patient denies SI/HI/AVH.  Specifically denies any, "Spirits" speaking to him today. Verbally contracts for safety on the unit and to come to staff before acting of any self harm thoughts/feelings/voices.  Patient interacting well with staff and other patients. Patient's affect is very bright and he is very proactive about asking for what he needs. On his self assessment patient reports 1/10 for depression, 0/10 for hopelessness and 4/10 for anxiety. His goal for today is, "Patience." Action. Emotional support and encouragement offered. Education provided on medication, indications and side effect. Q 15 minute checks done for safety. Response. Safety on the unit maintained through 15 minute checks.  Medications taken as prescribed. Attended groups. Remained calm and appropriate through out shift.

## 2017-12-30 NOTE — B[facility] Suicide Risk Assessment (Signed)
[facility] Discharge Suicide Risk Assessment   [facility]: Schizoaffective disorder, bipolar type [facility](HCC) Discharge Diagnoses: Substance Induced Psyhcotic Disorder Patient Active Problem List   Diagnosis Date Noted  . Schizoaffective disorder, bipolar type (HCC) [F25.0] 12/24/2017  . Psychotic disorder (HCC) [F29] 12/23/2017  . Auditory hallucinations [R44.0] 09/05/2017  . Polysubstance abuse (HCC) [F19.10] 09/04/2017  . Substance induced mood disorder (HCC) [F19.94] 09/04/2017  . Cocaine abuse (HCC) [F14.10] 06/26/2017    Total Time spent with patient: 45 minutes  Musculoskeletal: Strength & Muscle Tone: within normal limits Gait & Station: normal Patient leans: N/A  Psychiatric Specialty Exam: Review of Systems  Constitutional: Negative.   HENT: Negative.   Eyes: Negative.   Respiratory: Negative.   Cardiovascular: Negative.   Gastrointestinal: Negative.   Genitourinary: Negative.   Musculoskeletal: Negative.   Skin: Negative.   Neurological: Negative.   Endo/Heme/Allergies: Negative.   Psychiatric/Behavioral: Negative for depression, hallucinations, memory loss, substance abuse and suicidal ideas. The patient is not nervous/anxious and does not have insomnia.     Blood pressure 120/64, pulse 72, temperature 98.1 F (36.7 C), temperature source Oral, resp. rate 16, height 6\' 2"  (1.88 m), weight 78 kg (172 lb), SpO2 100 %.Body mass index is 22.08 kg/m.  General Appearance: Neatly dressed, pleasant, engaging well and cooperative. Appropriate behavior. Not in any distress. Good relatedness. Not internally stimulated.  Eye Contact::  Good  Speech:  Spontaneous, normal prosody. Normal tone and rate.   Volume:  Normal  Mood:  Euthymic  Affect:  Appropriate and Full Range  Thought Process:  Linear  Orientation:  Full (Time, Place, and Person)  Thought Content:  No delusional theme. No preoccupation with violent thoughts. No negative ruminations. No obsession.  No  hallucination in any modality.   Suicidal Thoughts:  No  Homicidal Thoughts:  No  Memory:  Immediate;   Good Recent;   Good Remote;   Good  Judgement:  Good  Insight:  Good  Psychomotor Activity:  Normal  Concentration:  Good  Recall:  Good  Fund of Knowledge:Good  Language: Good  Akathisia:  Negative  Handed:    AIMS (if indicated):     Assets:  Communication Skills Desire for Improvement Physical Health Resilience  Sleep:  Number of Hours: 6.75  Cognition: WNL  ADL's:  Intact   Clinical  Assessment::   27 y.o AAM, homeless, unemployed, limited support. Background history of SUD and Mood Disorder. Presented to the ER unaccompanied. Was very vague at presentation. He expressed feeling depressed and very angry. He expressed homicidal thoughts. Main stressor as being homeless and feeling not supported by his family. BAL was 18 mg/dl. UDS was positive for cocaine, opiates and THC. Patient was started on Risperidone and Depakote. Visual hallucination of spirits and rage responded to treatment.   Chart reviewed today. Patient discussed at team today. Patient reported to be doing well. [facility] Nursing staff reports that patient has been appropriate on the unit. Patient has been interacting well with peers. No behavioral issues. Patient has not voiced any suicidal thoughts. Patient has not been observed to be internally stimulated. Patient has been adherent with treatment recommendations. Patient has been tolerating their medication well.   Seen today. Patient was initially evasive. Did not want to talk about how he felt when he came here. Says he is no longer having any violent thoughts or homicidal thoughts. He just wants to get into rehab and deal with his addiction. Reports that he is in  good spirits. Not feeling depressed. Reports normal energy and interest. Has been maintaining normal biological functions. He is able to think clearly. He is able to focus on  task. His thoughts are not crowded or racing. No evidence of mania. No hallucination in any modality. He is not making any delusional statement. No passivity of will/thought. He is fully in touch with reality. No thoughts of suicide. No thoughts of homicide. No violent thoughts. No overwhelming anxiety. No access to weapons. He has been tolerating his medications well. No residual withdrawal symptoms.    Demographic Factors:  Male and Low socioeconomic status  Loss Factors: Financial problems/change in socioeconomic status  Historical Factors: Impulsivity  Risk Reduction Factors:   Positive social support, Positive therapeutic relationship and Positive coping skills or problem solving skills  Continued Clinical Symptoms:  As above   Cognitive Features That Contribute To Risk:  None    Suicide Risk:  Minimal: No identifiable suicidal ideation.  Patient is not having any thoughts of suicide at this time. Modifiable risk factors targeted during this admission includes Mood disorder and substance use. Demographical and historical risk factors cannot be modified. Patient is now engaging well. Patient is reliable and is future oriented. We have buffered patient's support structures. At this point, patient is at low risk of suicide. Patient is aware of the effects of psychoactive substances on decision making process. Patient has been provided with emergency contacts. Patient acknowledges to use resources provided if unforseen circumstances changes their current risk stratification.    Follow-up Information    [facility] Follow up on 12/31/2017.   Why:  Follow up iwith them after you leave [facility].  Please bring any proof of income, ID, and your social security card.  Contact information: [address] [place] [address] [phone]        [facility] Follow up on 12/31/2017.   Why:  Wednesday at 8AM for your screening for admission appointment.  Make sure you have 2 weeks  of medication and a piece of mail with your [facility] address. Contact information: [address] [place] [phone]           Plan Of Care/Follow-up recommendations:  1. Continue current psychotropic medications 2. Mental health and addiction follow up as arranged.  3. Provided limited quantity of prescriptions   Georgiann Cocker, MD 12/30/2017, 7:36 PM

## 2017-12-30 NOTE — [facility] Group Notes (Signed)
[facility] Group Notes:  (Nursing/MHT/Case Management/Adjunct)  Date:  12/30/2017  Time:  4:28 PM  Type of Therapy:  Nurse Education  Participation Level:  Did Not Attend  Summary of Progress/Problems:  This was a Psychoeducational group specifically related to the use of the therapy ball.  Clayton Vaughan 12/30/2017, 4:28 PM
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
## 2017-12-30 NOTE — [facility] Group Notes (Signed)
LCSW Group Therapy Note 12/30/2017 2:34 PM  Type of Therapy/Topic: Group Therapy: Feelings about Diagnosis  Participation Level: Active   Description of Group:  This group will allow patients to explore their thoughts and feelings about diagnoses they have received. Patients will be guided to explore their level of understanding and acceptance of these diagnoses. Facilitator will encourage patients to process their thoughts and feelings about the reactions of others to their diagnosis and will guide patients in identifying ways to discuss their diagnosis with significant others in their lives. This group will be process-oriented, with patients participating in exploration of their own experiences, giving and receiving support, and processing challenge from other group members.  Therapeutic Goals: 1. Patient will demonstrate understanding of diagnosis as evidenced by identifying two or more symptoms of the disorder 2. Patient will be able to express two feelings regarding the diagnosis 3. Patient will demonstrate their ability to communicate their needs through discussion and/or role play  Summary of Patient Progress:  Clayton Vaughan was engaged throughout the group session. He participated and contributed to the group's discussion. Clayton Vaughan stated that he plans to better explain what he goes through with his supports, so that they can be more beneficial to his road to recovery and to help them understand mental health better.   Therapeutic Modalities:  Cognitive Behavioral Therapy Brief Therapy Feelings Identification    [name] [name] LCSWA Clinical Social Worker

## 2017-12-30 NOTE — B[facility] Suicide Risk Assessment (Signed)
SPE completed with patient, as patient refused to consent to family contact. SPI pamphlet provided to pt and pt was encouraged to share information with support network, ask questions, and talk about any concerns relating to SPE. Patient denies access to guns/firearms and verbalized understanding of information provided. Mobile Crisis information also provided to patient.    

## 2017-12-31 NOTE — Progress Notes (Signed)
Pt attend wrap up group. His day was 9. His goal to be patient and not to rush things. He trying to cope.

## 2017-12-31 NOTE — Progress Notes (Signed)
Pt has been in the dayroom talking with peers and watching TV.  He reports that he is discharging tomorrow to go to [facility].  He says he is ready for the next step in his treatment.  He denies SI/HI/AVH.  He voiced no needs or concerns to Clinical research associatewriter.  He will be discharged in the morning to go by taxi to [facility].  Support and encouragement offered.  Discharge plans are in process.  Safety maintained with q15 minute checks.

## 2017-12-31 NOTE — Progress Notes (Signed)
Pt was discharged this morning to go to [facility] for an admission screening.  He is traveling by taxi.  Pt reports he feels safe to discharge and is ready for the next step of his treatment.  Writer reviewed the discharge paperwork with pt and all paperwork was signed.  Pt was given his sample meds and prescriptions to take with him and also all the belongings from locker # 37 were returned to the pt.  Pt was provided a meal before leaving.
